# Patient Record
Sex: Female | Born: 1965 | Race: White | Hispanic: No | Marital: Married | Smoking: Never smoker
Health system: Southern US, Community
[De-identification: ages and names within clinical notes are randomized; demographics above are authoritative.]

## PROBLEM LIST (undated history)

## (undated) DIAGNOSIS — K8020 Calculus of gallbladder without cholecystitis without obstruction: Secondary | ICD-10-CM

## (undated) DIAGNOSIS — G43909 Migraine, unspecified, not intractable, without status migrainosus: Secondary | ICD-10-CM

## (undated) HISTORY — DX: Calculus of gallbladder without cholecystitis without obstruction: K80.20

## (undated) HISTORY — DX: Migraine, unspecified, not intractable, without status migrainosus: G43.909

---

## 2005-02-20 HISTORY — PX: LAPAROSCOPIC, COLECTOMY SIGMOID, LAR: SHX4477

## 2009-10-21 ENCOUNTER — Ambulatory Visit
Admission: RE | Admit: 2009-10-21 | Disposition: A | Discharge status: Home / Self Care | Payer: Self-pay | Source: Ambulatory Visit | Admitting: Gastroenterology

## 2010-07-31 ENCOUNTER — Emergency Department
Admission: EM | Admit: 2010-07-31 | Disposition: A | Discharge status: Home / Self Care | Payer: Self-pay | Source: Emergency Department | Admitting: Emergency Medicine

## 2013-10-17 ENCOUNTER — Other Ambulatory Visit
Admission: RE | Admit: 2013-10-17 | Discharge: 2013-10-17 | Disposition: A | Discharge status: Home / Self Care | Payer: No Typology Code available for payment source | Source: Ambulatory Visit | Attending: Gastroenterology | Admitting: Gastroenterology

## 2013-10-17 ENCOUNTER — Ambulatory Visit: Payer: Self-pay

## 2013-10-17 DIAGNOSIS — R1032 Left lower quadrant pain: Secondary | ICD-10-CM | POA: Insufficient documentation

## 2013-10-17 DIAGNOSIS — D126 Benign neoplasm of colon, unspecified: Secondary | ICD-10-CM

## 2013-10-20 LAB — LAB USE ONLY - HISTORICAL SURGICAL PATHOLOGY

## 2015-11-25 ENCOUNTER — Ambulatory Visit (INDEPENDENT_AMBULATORY_CARE_PROVIDER_SITE_OTHER): Payer: No Typology Code available for payment source | Admitting: Neurology

## 2015-11-25 ENCOUNTER — Encounter (INDEPENDENT_AMBULATORY_CARE_PROVIDER_SITE_OTHER): Payer: Self-pay | Admitting: Neurology

## 2015-11-25 VITALS — BP 128/79 | HR 69 | Resp 18 | Ht 66.0 in | Wt 178.0 lb

## 2015-11-25 DIAGNOSIS — G4440 Drug-induced headache, not elsewhere classified, not intractable: Secondary | ICD-10-CM

## 2015-11-25 DIAGNOSIS — M5481 Occipital neuralgia: Secondary | ICD-10-CM | POA: Insufficient documentation

## 2015-11-25 DIAGNOSIS — G43019 Migraine without aura, intractable, without status migrainosus: Secondary | ICD-10-CM

## 2015-11-25 MED ORDER — LIDOCAINE 5 % EX OINT
TOPICAL_OINTMENT | CUTANEOUS | 2 refills | Status: AC | PRN
Start: 2015-11-25 — End: ?

## 2015-11-25 MED ORDER — GABAPENTIN 300 MG PO CAPS
900.0000 mg | ORAL_CAPSULE | Freq: Every evening | ORAL | 6 refills | Status: AC
Start: 2015-11-25 — End: 2015-12-25

## 2015-11-25 MED ORDER — PREDNISONE 10 MG PO TABS
ORAL_TABLET | ORAL | 0 refills | Status: AC
Start: 2015-11-25 — End: ?

## 2015-11-25 NOTE — Progress Notes (Signed)
Maria Lowery, HEUMANN  Dec 08, 1965  [id number]      [facility]  50 y.o.  right handed handed female presents with headaches. Headaches started at age 67. The headaches are described as fire sesation. The pain comes on gradually, and does not resolves. The pain does sometimes wake the patient up at night, and sometimes present in the morning. There is no prodrome. There is no aura.    Began having "firing sensation" on the posterior head and radiate to the front of the head. Is constant is 8/10. Does not frequently has any pain free hours. Symptoms began 2.5 years ago  Migraines occur once a month but can last 3 days     Headaches are treated with Lyrica/Gabapentin. Naproxen, tylenol - treating daily     Associated symptoms:  Photophobia: No with current symptoms but migraines  Phonophobia: No with current symptoms but migraines  Osmophobia: No with current symptoms but migraines  Nausea: No with current symptoms but migraines  Vomiting: No with current symptoms but migraines  Anorexia/Hunger: No with current symptoms but migraines  Difficulty Thinking or Concentrating: Yes  Irritability/Depression: Yes   Fatigue: Yes   Blurry Vision: No with current symptoms but migraines  Hemianopsia: No with current symptoms but migraines  Micro/macrophotopsia: Yes  Allodynia: Yes  Neck Stiffness/Pain: Yes   Autonomic symptoms:  Ptosis or Miosis: No  Lacrimation: No  Conjuctival Injection: No  Rhinorrhea: No  Congestion: No  Foreign Body Sensation:No  Facial Sweating: No  Pallor: No  Neurological symptoms:  Diplopia: Yes - only with migraine  Dysarthria: No   Perioral Numbness: No   Vertigo: No   Aphasia: Yes   Hemisensory:Yes with migraine 2 hours   Hemiparesis/Hemiplegia: Yes with migraine 2 hours    Exacerbating factors:  Movement: Yes with migraine  Upright position: Yes with migraine   Lying down: No   Bending over: Yes with migraine   Coughing: Yes with migraine   Straining: Yes with migraine  Alleviating  factors:  Rest/Sleep: Yes   Darkness: Yes   Quiet: Yes   Activity: No   Heat: Yes   Cold: No    Triggers:  Stress: Yes  Post stress letdown: Yes  Bright/Flickering lights: No   Sounds: No   Smells: No   Too much/too little sleep: No   Skipping meals: Yes   Changes in the weather: No    Menses: Yes   Pregnancy: No   Alcohol: No   Foods: No  Airplane Flights: Yes   Exercise: Yes   Other:       Alternative treatments:  Massage: No   Acupuncture: Yes  Biofeedback: No    Prior work-up:  MRI of the brain - Normal April 2016    Prior meds:  Gabapentin   Imitrex - can   Aleve    No past medical history on file.      Current Outpatient Prescriptions:   .  acetaminophen (TYLENOL) 500 MG tablet, Take 500 mg by mouth., Disp: , Rfl:   .  gabapentin (NEURONTIN) 100 MG capsule, Take 100 mg by mouth nightly., Disp: , Rfl:   .  ibuprofen (ADVIL,MOTRIN) 200 MG tablet, Take 200 mg by mouth every 6 (six) hours as needed for Pain., Disp: , Rfl:   .  LO LOESTRIN FE 1 MG-10 MCG / 10 MCG Tab, , Disp: , Rfl:   .  naproxen sodium (ANAPROX) 220 MG tablet, Take 220 mg by mouth 2 (two)  times daily with meals., Disp: , Rfl:   .  gabapentin (NEURONTIN) 300 MG capsule, Take 3 capsules (900 mg total) by mouth nightly., Disp: 90 capsule, Rfl: 6  .  lidocaine (XYLOCAINE) 5 % ointment, Apply topically as needed (pain)., Disp: 50 g, Rfl: 2  .  predniSONE (DELTASONE) 10 MG tablet, Taper as follows: 6 tab day 1,2; 5 tab day 3,4; 4 tabs day 5,6; 3 tab day 7,8; 2 tab day 9, 1 tab day 10, Disp: 39 tablet, Rfl: 0    Allergies   Allergen Reactions   . Other      IV Contrast dye       Social History     Social History   . Marital status: Married     Spouse name: N/A   . Number of children: N/A   . Years of education: N/A     Occupational History   . Not on file.     Social History Main Topics   . Smoking status: Never Smoker   . Smokeless tobacco: Never Used   . Alcohol use No   . Drug use: No   . Sexual activity: Not on file     Other Topics Concern   . Not  on file     Social History Narrative   . No narrative on file       Family History   Problem Relation Age of Onset   . Migraines Father        Lifestyle: does not exercise, sleeps 7 hours with a regular schedule, eats breakfast, drinks 6 cups of fluids (tea) and 6 cups of caffeine (Tea)     Review of Systems:  General: patient denies fever, chills    Head and neck: patient denies sore throat, blurry vision  Pulmonary: patient denies cough, SOB  Cardiovascular/cholesterol: patient denies chest pain, palpitations   Neurologic: See HPI   GI: patient denies nausea, vomiting, diarrhea or constipation  GU: patient denies dysuria   Women:  Age of menarche: 38  Age of menopause: 18     Regular cycles: No  OCP use: yes     Number of pregnancies: 2 Number of miscarriages: 0  Endocrine: patient denies heat/cold intolerance   Rheum: patient denies joint pain  Heme/Onc: patient denies easy bruising  Derm/bone: patient denies rashes  Psychiatric: patient denies anxiety or depression     Childhood Precursors:  Cyclical Vomiting: No  Motion Sickness: Yes  Somnambulism: No    PE:  Vitals:    11/25/15 1113   BP: 128/79   BP Site: Left arm   Patient Position: Sitting   Cuff Size: Medium   Pulse: 69   Resp: 18   Weight: 80.7 kg (178 lb)   Height: 1.676 m (5\' 6" )       HEENT: NCAT  Heart: RRR, no bruits  Resp: CTAB  Abd: NTND  Neuro:  Mental status: normal cognition, fluent, psychological good affect   Cranial nerves: PERRL, disks sharp, EOMI, VFF, sens intact to LT, face symmetric, hearing intact to voice, tongue/uvula/palate midline, good SCM strength and shoulder shrug  Motor: no pronator drift, normal tone and bulk, 5/5  Sensory: intact to light touch, cold, vibration  DTR: 2+ throughout, toes down  Coor: intact FTN and HTS B/L   Gait: narrow, toe/heel/tandem intact, negative Romberg    Impression:  50 y.o. female with   1. Medication overuse headache     2. Intractable migraine without aura and without  status migrainosus     3.  Bilateral occipital neuralgia       - Has infrequent migraine, symptoms of occipital neuralgia  - Using OTC daily which are likely contributing to headache frequency  - Stop OTC for the next 2 weeks, then limit to 2 days/week   - Gabapentin 300 -> 600 -> 900  - prednisone taper   - Occipital nerve block   - May benefit from magnesium   - Lidocaine cream  - May benefit from another migraine rescue but will hold off until occipital neuralgia pain better controlled     Lifestyle modifications:  continue exercise, keep a regular sleep schedule, have breakfast with protein, continue hydration;  - RTC ASAP for nerve block, then 2 monts    Greater than 30 minutes of this 45 minute visit spent on education and counseling regarding diagnosis and treatment including lifestyle modifications, proper use of preventive and abortive medication and avoidance of medication overuse.

## 2015-11-25 NOTE — Patient Instructions (Addendum)
1. Instructions were provided regarding your diagnosis and treatment plan.     2. Lifestyle modifications were reviewed: exercise regularly, keep a regular sleep schedule, eat breakfast with protein, drink plenty of fluids.     3. Remember to limit acute headache treatment medicines (like triptans or anti-inflammatories) to 2 days/week to avoid medication-overuse headache.    For migraine education resources:  American Headache Society: https://americanheadachesociety.org  American Migraine Foundation: https://americanmigrainefoundation.org    Stop Over the counter medications for the next 2 weeks, then after that limit use to 3 days/week     Start Gabapentin 300 mg at bedtime for 2 weeks, then increase to 2 tabs for 2 weeks, then 3 tabs     Code for nerve block is CPT code 96045    Prednisone taper schedule with Prednisone 10 mg tablets:  Day 1 and 2: 6 tablets in the morning with breakfast  Day 3 and 4: 5 tablets in the morning with breakfast  Day 5 and 6: 4 tablets in the morning with breakfast  Day 7 and 8: 3 tablets in the morning with breakfast  Day 9: 2 tablets in the morning with breakfast  Day 10: 1 tablet in the morning with breakfast     Can use lidocaine cream on posterior head pain

## 2016-01-25 ENCOUNTER — Ambulatory Visit (INDEPENDENT_AMBULATORY_CARE_PROVIDER_SITE_OTHER): Payer: No Typology Code available for payment source | Admitting: Acute Care

## 2016-04-17 ENCOUNTER — Encounter (INDEPENDENT_AMBULATORY_CARE_PROVIDER_SITE_OTHER): Payer: Self-pay | Admitting: Neurology

## 2016-04-17 ENCOUNTER — Ambulatory Visit (INDEPENDENT_AMBULATORY_CARE_PROVIDER_SITE_OTHER): Payer: No Typology Code available for payment source | Admitting: Neurology

## 2016-04-17 VITALS — BP 112/71 | HR 77 | Ht 66.0 in | Wt 180.0 lb

## 2016-04-17 DIAGNOSIS — M5481 Occipital neuralgia: Secondary | ICD-10-CM

## 2016-04-17 DIAGNOSIS — G43019 Migraine without aura, intractable, without status migrainosus: Secondary | ICD-10-CM

## 2016-04-17 MED ORDER — GABAPENTIN 300 MG PO CAPS
ORAL_CAPSULE | ORAL | 11 refills | Status: AC
Start: 2016-04-17 — End: ?

## 2016-04-17 MED ORDER — ELETRIPTAN HYDROBROMIDE 40 MG PO TABS
40.0000 mg | ORAL_TABLET | ORAL | 11 refills | Status: AC | PRN
Start: 2016-04-17 — End: 2016-05-17

## 2016-04-17 NOTE — Patient Instructions (Addendum)
1. Instructions were provided regarding your diagnosis and treatment plan.     2. Lifestyle modifications were reviewed: exercise regularly, keep a regular sleep schedule, eat breakfast with protein, drink plenty of fluids.     3. Remember to limit acute headache treatment medicines (like triptans or anti-inflammatories) to 2 days/week to avoid medication-overuse headache.    For migraine education resources:  American Headache Society: https://americanheadachesociety.org  American Migraine Foundation: https://americanmigrainefoundation.org    Increase Gabapentin to 1200mg  at night for 2 weeks, then 1 tab in the am, 4 at night.     Start Magnesium citrate or glycinate 400mg      Trial of relpax

## 2016-04-17 NOTE — Progress Notes (Signed)
Maria Lowery, FAROOQ  09/20/1965  [id number]    [facility]    51 y.o. female last seen on 11/25/15 for occipital neuralgia.    Has been taking Gabapentin 900mg      Every time she goes up a dose, pain improves.     Hormones increased blood pressure.     Feels pain on entire head, sometimes on vertex. Does not have days without the     Gets migraines twice a week, treats with naproxen but last 1 day. Is careful to not overuse NSAIDs       Current Outpatient Prescriptions:   .  acetaminophen (TYLENOL) 500 MG tablet, Take 500 mg by mouth., Disp: , Rfl:   .  eletriptan (RELPAX) 40 MG tablet, Take 1 tablet (40 mg total) by mouth as needed for Migraine.Max 2 tabs in 24 hours, limit use to 10 days/month, Disp: 12 tablet, Rfl: 11  .  gabapentin (NEURONTIN) 100 MG capsule, Take 100 mg by mouth nightly., Disp: , Rfl:   .  gabapentin (NEURONTIN) 300 MG capsule, 4 tabs at bedtime for 2 weeks, then 1 in the am and 4 at night, Disp: 150 capsule, Rfl: 11  .  ibuprofen (ADVIL,MOTRIN) 200 MG tablet, Take 200 mg by mouth every 6 (six) hours as needed for Pain., Disp: , Rfl:   .  lidocaine (XYLOCAINE) 5 % ointment, Apply topically as needed (pain)., Disp: 50 g, Rfl: 2  .  LO LOESTRIN FE 1 MG-10 MCG / 10 MCG Tab, , Disp: , Rfl:   .  naproxen sodium (ANAPROX) 220 MG tablet, Take 220 mg by mouth 2 (two) times daily with meals., Disp: , Rfl:   .  predniSONE (DELTASONE) 10 MG tablet, Taper as follows: 6 tab day 1,2; 5 tab day 3,4; 4 tabs day 5,6; 3 tab day 7,8; 2 tab day 9, 1 tab day 10, Disp: 39 tablet, Rfl: 0    PRIOR MEDS:  Gabapentin   Imitrex   Aleve    ROS:  Is not exercising, sleep 7 hours, eats breakfast, drinks at least 8 cups of fluids   Negative for visual changes, rash, bleeding abnormalities, cardiac, GI, respiratory issues    PE:  Vitals:    04/17/16 0802   BP: 112/71   BP Site: Right arm   Patient Position: Sitting   Cuff Size: Medium   Pulse: 77   Weight: 81.6 kg (180 lb)   Height: 1.676 m (5\' 6" )       GEN:  no acute distress  CV: RR  MS: fluent with no aphasia   CN: PERRL, EOMI, VFF, face symmetric, T midline  M: no PND, normal tone, 5/5 throughout   Gait: narrow with good armswing    IMP:  50 y.o.female with   1. Intractable migraine without aura and without status migrainosus     2. Bilateral occipital neuralgia       - Some slight improvement but still has symptoms   - Increase Gabapentin gradually to 300 and 1200 mg   - Start magnesium 400 mg daily for prevention   - Relpax for migraine rescue    - Lifestyle: exercise as tolerated, keep a regular sleep schedule, eat breakfast with protein, increase hydration      Greater than 15 minutes of this 30 minute visit was spent on education and counseling regarding diagnosis of migraine headaches and occipital neuralgia and treatment that includes medication and lifestyle modifications that include exercise 4 days  per week, increase non-caffeinated fluid intake to 64 ounces per day, eat regular meals and breakfast with protein, limit caffeine intake to 200mg  per day and sleep 7-8 hour per night; proper use of the preventive medication which is gabapentin and the abortive medication which is relpax. Also limit use of rescue medications to 10 days per month (15 days for NSAIDs) to avoid medication overuse.

## 2016-05-15 ENCOUNTER — Ambulatory Visit (INDEPENDENT_AMBULATORY_CARE_PROVIDER_SITE_OTHER): Payer: No Typology Code available for payment source | Admitting: Neurology

## 2016-07-21 ENCOUNTER — Encounter: Payer: Self-pay | Admitting: Neurology

## 2016-07-21 DIAGNOSIS — R519 Headache, unspecified: Secondary | ICD-10-CM

## 2016-08-14 ENCOUNTER — Ambulatory Visit
Admission: RE | Admit: 2016-08-14 | Discharge: 2016-08-14 | Disposition: A | Discharge status: Home / Self Care | Payer: No Typology Code available for payment source | Source: Ambulatory Visit | Attending: Neurology | Admitting: Neurology

## 2016-08-14 ENCOUNTER — Other Ambulatory Visit: Payer: Self-pay | Admitting: Neurology

## 2016-08-14 DIAGNOSIS — R519 Headache, unspecified: Secondary | ICD-10-CM

## 2016-08-14 DIAGNOSIS — R51 Headache: Secondary | ICD-10-CM | POA: Insufficient documentation

## 2016-08-21 ENCOUNTER — Ambulatory Visit (INDEPENDENT_AMBULATORY_CARE_PROVIDER_SITE_OTHER)
Admission: RE | Admit: 2016-08-21 | Discharge: 2016-08-21 | Disposition: A | Discharge status: Home / Self Care | Payer: No Typology Code available for payment source | Source: Ambulatory Visit | Attending: Neurology | Admitting: Neurology

## 2016-08-21 ENCOUNTER — Other Ambulatory Visit: Payer: Self-pay | Admitting: Neurology

## 2016-08-21 DIAGNOSIS — R519 Headache, unspecified: Secondary | ICD-10-CM

## 2019-03-03 ENCOUNTER — Other Ambulatory Visit: Payer: Self-pay | Admitting: Family

## 2019-03-24 ENCOUNTER — Emergency Department: Payer: No Typology Code available for payment source

## 2019-03-24 ENCOUNTER — Emergency Department
Admission: EM | Admit: 2019-03-24 | Discharge: 2019-03-24 | Disposition: A | Discharge status: Home / Self Care | Payer: No Typology Code available for payment source | Attending: Emergency Medicine | Admitting: Emergency Medicine

## 2019-03-24 DIAGNOSIS — R200 Anesthesia of skin: Secondary | ICD-10-CM | POA: Insufficient documentation

## 2019-03-24 DIAGNOSIS — W19XXXA Unspecified fall, initial encounter: Secondary | ICD-10-CM

## 2019-03-24 DIAGNOSIS — Y9389 Activity, other specified: Secondary | ICD-10-CM | POA: Insufficient documentation

## 2019-03-24 DIAGNOSIS — S22080A Wedge compression fracture of T11-T12 vertebra, initial encounter for closed fracture: Secondary | ICD-10-CM

## 2019-03-24 DIAGNOSIS — S22089A Unspecified fracture of T11-T12 vertebra, initial encounter for closed fracture: Secondary | ICD-10-CM | POA: Insufficient documentation

## 2019-03-24 DIAGNOSIS — Y92000 Kitchen of unspecified non-institutional (private) residence as  the place of occurrence of the external cause: Secondary | ICD-10-CM | POA: Insufficient documentation

## 2019-03-24 DIAGNOSIS — R202 Paresthesia of skin: Secondary | ICD-10-CM | POA: Insufficient documentation

## 2019-03-24 DIAGNOSIS — W1830XA Fall on same level, unspecified, initial encounter: Secondary | ICD-10-CM | POA: Insufficient documentation

## 2019-03-24 LAB — CBC AND DIFFERENTIAL
Absolute NRBC: 0 10*3/uL (ref 0.00–0.00)
Basophils Absolute Automated: 0.04 10*3/uL (ref 0.00–0.08)
Basophils Automated: 0.4 %
Eosinophils Absolute Automated: 0.01 10*3/uL (ref 0.00–0.44)
Eosinophils Automated: 0.1 %
Hematocrit: 44.7 % — ABNORMAL HIGH (ref 34.7–43.7)
Hgb: 14.8 g/dL (ref 11.4–14.8)
Immature Granulocytes Absolute: 0.04 10*3/uL (ref 0.00–0.07)
Immature Granulocytes: 0.4 %
Lymphocytes Absolute Automated: 2.1 10*3/uL (ref 0.42–3.22)
Lymphocytes Automated: 18.5 %
MCH: 29.4 pg (ref 25.1–33.5)
MCHC: 33.1 g/dL (ref 31.5–35.8)
MCV: 88.9 fL (ref 78.0–96.0)
MPV: 10.2 fL (ref 8.9–12.5)
Monocytes Absolute Automated: 0.74 10*3/uL (ref 0.21–0.85)
Monocytes: 6.5 %
Neutrophils Absolute: 8.4 10*3/uL — ABNORMAL HIGH (ref 1.10–6.33)
Neutrophils: 74.1 %
Nucleated RBC: 0 /100 WBC (ref 0.0–0.0)
Platelets: 325 10*3/uL (ref 142–346)
RBC: 5.03 10*6/uL (ref 3.90–5.10)
RDW: 13 % (ref 11–15)
WBC: 11.33 10*3/uL — ABNORMAL HIGH (ref 3.10–9.50)

## 2019-03-24 LAB — COMPREHENSIVE METABOLIC PANEL
ALT: 112 U/L — ABNORMAL HIGH (ref 0–55)
AST (SGOT): 52 U/L — ABNORMAL HIGH (ref 5–34)
Albumin/Globulin Ratio: 1.3 (ref 0.9–2.2)
Albumin: 4.5 g/dL (ref 3.5–5.0)
Alkaline Phosphatase: 71 U/L (ref 37–106)
Anion Gap: 12 (ref 5.0–15.0)
BUN: 17.3 mg/dL (ref 7.0–19.0)
Bilirubin, Total: 0.3 mg/dL (ref 0.2–1.2)
CO2: 26 mEq/L (ref 22–29)
Calcium: 10.3 mg/dL (ref 8.5–10.5)
Chloride: 103 mEq/L (ref 100–111)
Creatinine: 0.7 mg/dL (ref 0.6–1.0)
Globulin: 3.5 g/dL (ref 2.0–3.6)
Glucose: 93 mg/dL (ref 70–100)
Potassium: 4.7 mEq/L (ref 3.5–5.1)
Protein, Total: 8 g/dL (ref 6.0–8.3)
Sodium: 141 mEq/L (ref 136–145)

## 2019-03-24 LAB — GFR: EGFR: >60

## 2019-03-24 LAB — LIPASE: Lipase: 31 U/L (ref 8–78)

## 2019-03-24 MED ORDER — CYCLOBENZAPRINE HCL 10 MG PO TABS
10.0000 mg | ORAL_TABLET | Freq: Three times a day (TID) | ORAL | 0 refills | Status: AC | PRN
Start: 2019-03-24 — End: ?

## 2019-03-24 MED ORDER — SODIUM CHLORIDE 0.9 % IV BOLUS
1000.0000 mL | Freq: Once | INTRAVENOUS | Status: AC
Start: 2019-03-24 — End: 2019-03-24
  Administered 2019-03-24: 1000 mL via INTRAVENOUS

## 2019-03-24 NOTE — Discharge Instructions (Signed)
Compression Fracture    You were seen and treated for a compression fracture in the back.      Your back is made up of many vertebrae. These bones are square-shaped and they protect your spinal cord and nerves. Trauma can cause the vertebrae to be compressed (squashed). The compression causes the vertebrae to lose height. Most fractures do not require surgery. However, sometimes a piece of bone can be chipped off into the spinal canal. This is where the spinal cord is. If this happens, surgery is needed to take out the piece of bone. If the fracture does not need surgery you will be put in a TLSO (thoracolumbarsacral) brace. This helps keep the spine straight to lower pain and prevent more injury.     These fractures can happen with little trauma if there is an underlying disease that weakens the bones. This can be osteoporosis (weak bones) or metastatic disease (cancer that has spread to the bone). These fractures also happen in healthy patients usually from major trauma like a fall or car accident.     Some common symptoms of a compression fracture are:   Back pain.   Weakness in the legs.   Numbness or tingling in the legs or near the genitals (privates).   Urine (pee) or fecal (poop) incontinence (leaking pee or poop).      Shortness of breath.   Chest pain.   Abdominal (belly) pain.    Though we don't believe your condition is dangerous right now, it is important to be careful. Sometimes a problem that seems mild can become serious later. This is why it is very important that you return here or go to the nearest Emergency Department if you are not improving or your symptoms are getting worse.    You may be given medications when you leave the hospital. These may include one or more of the following:     Pain medication - Do not drive or use heavy machinery while taking narcotic (opiate) pain medications. You can take over-the-counter pain medicines instead if you are not in a lot of pain. These  medicines can include ibuprofen (Advil/Motrin) or acetaminophen (Tylenol). Follow the directions on the package. Pain medication can upset your stomach. If so, take nausea medicine before you take the pain medication.   Nausea medication - These medications are to treat nausea and keep you from vomiting (throwing up). Some medications may make you sleepy (drowsy), but others don't have many side effects. Some medications are given as suppositories or dissolvable tablets. This way, you don't have to swallow them if you feel sick to your stomach (nauseated). Follow the directions on the package.     Stool softeners - These medications help with the constipation caused by narcotic medications and surgery. Constipation and hard stools (hard poop) can make the pain in your abdomen (belly) much worse.   You will be scheduled for a follow-up appointment. This is usually with your primary care doctor. Since you have a fracture, you may follow up with an orthopedic surgeon (bone doctor) or a neurosurgeon (brain, spinal cord and nerve surgeon). Call your doctor as soon as possible if you can't keep your appointment. You can also call if you have questions about your symptoms.     Back Brace Instructions:   Keep your brace dry.    Itching. Do not stick objects like coat hangers inside the brace to scratch itching skin. If itching continues, contact your doctor.   Trimming. Do   not break rough edges off the brace or trim the brace before asking your doctor.   Skin. Look at the skin around the brace. If your skin gets red or raw around the brace, contact your doctor.   Inspect the brace regularly. If it becomes cracked or gets soft spots, contact your doctor's office.   Your level of activity will depend on how serious your fracture is. Talk about your limitations with your doctor before leaving the hospital.     YOU SHOULD SEEK MEDICAL ATTENTION IMMEDIATELY, EITHER HERE OR AT THE NEAREST EMERGENCY DEPARTMENT, IF  ANY OF THE FOLLOWING OCCUR:   You see red streaks going up from the surgical site.    You have weakness in your legs.   You have numbness or tingling in your legs or near the genitals (privates).   You have urine or fecal incontinence (you wet or soil yourself).    You have abdominal (belly) pain.   You have swelling or pain in your calf.    Your chest hurts a lot or you get short of breath.    If you can't follow up with your doctor, or if at any time you feel you need to be rechecked or seen again, come back here or go to the nearest emergency department.               Paresthesias    You have been seen for paresthesias.    Paresthesia is an abnormal sensation (feeling) in any part of the body. The paresthesia itself has no long-term bad effects. People often describe it as tingling, numbness, burning, or pricking of the skin. Many say it feels like "pins and needles," or like the body part is asleep.    Paresthesias can be a symptom of some illnesses. This means there are many things that can cause paresthesias. The paresthesias can be a sign of an underlying medical condition.     Some causes of paresthesias are:   Skin Problems: Irritation of skin by certain chemicals. Swelling of the skin from an injury. A burn or frostbite can feel like numbness.   Pressure on a nerve. This can happen when your arm "falls asleep" from lying on it too long. Carpal tunnel syndrome can do the same thing.   Hyperventilation (rapid or deep breathing).   Deficiency in some vitamins and minerals. This includes vitamins B1, B5, and B12.   Electrolyte problems.   Diabetic neuropathy (nerve disorders) from long-standing diabetes.   Problems with circulation.   Strokes.    You may have had some testing to help find out the cause of your paresthesias.    We still do not know the cause of your paresthesias. However, it is OK for you to go home. You may need more tests to figure out the cause.    See your primary  care doctor or the referral specialist for more work-up and management of your paresthesias.    YOU SHOULD SEEK MEDICAL ATTENTION IMMEDIATELY, EITHER HERE OR AT THE NEAREST EMERGENCY DEPARTMENT, IF ANY OF THE FOLLOWING OCCUR:   Your arms get weak, numb or paralyzed (can't move), especially on one side.   You have vision loss, trouble speaking or problems thinking.   Your speech is abnormal or one side of your face droops.   You lose consciousness ("pass out") or almost lose consciousness.   You have numbness or tingling after a head, neck or back injury.   You feel very dizzy or  like the room is spinning.   You have other concerns.

## 2019-03-24 NOTE — ED Provider Notes (Signed)
Nursing (triage) note reviewed for the following pertinent information:         History     Chief Complaint   Patient presents with   . Back Pain   . Numbness     54 year old female presents to the emergency department after a fall at home 4 days ago.  She was trying to pull her refrigerator out and the handle broke.  She fell backwards onto her back.  She felt "shock" throughout her entire body.  She immediately had to urinate.  She thinks she hit her head.  She has been having numbness and tingling in her arms and legs since then.  Yesterday she also started with tingling in her tongue and mouth.  [facility] negative.  She came to the ER now because of the continued tingling.  She has no bowel or bladder incontinence or retention.  No numbness or tingling around her rectum.  Normal strength.  No pain when she is lying in the bed.  The pain is worse when she is up and moving.  She declines pain medication.  Patient has been having some abdominal pain since the fall.  She says she has chronic abdominal pain from her gallbladder but this feels different.    The history is provided by the patient.   Back Pain  Associated symptoms: abdominal pain, headaches and numbness    Associated symptoms: no chest pain, no dysuria, no fever and no weakness         Past Medical History:   Diagnosis Date   . Gall stones    . Migraine        Past Surgical History:   Procedure Laterality Date   . LAPAROSCOPIC, COLECTOMY SIGMOID, LAR  2007       Family History   Problem Relation Age of Onset   . Migraines Father        Social  Social History     Tobacco Use   . Smoking status: Never Smoker   . Smokeless tobacco: Never Used   Substance Use Topics   . Alcohol use: No   . Drug use: No       .     Allergies   Allergen Reactions   . Other      IV Contrast dye   . Sulfa Antibiotics        Home Medications             gabapentin (NEURONTIN) 100 MG capsule     Take 100 mg by mouth nightly.      gabapentin (NEURONTIN) 300 MG capsule     4 tabs at bedtime for 2 weeks, then 1 in the am and 4 at night           Flagged for Removal             acetaminophen (TYLENOL) 500 MG tablet     Take 500 mg by mouth.     ibuprofen (ADVIL,MOTRIN) 200 MG tablet     Take 200 mg by mouth every 6 (six) hours as needed for Pain.     lidocaine (XYLOCAINE) 5 % ointment     Apply topically as needed (pain).     LO LOESTRIN FE 1 MG-10 MCG / 10 MCG Tab          naproxen sodium (ANAPROX) 220 MG tablet     Take 220 mg by mouth 2 (two) times daily  with meals.     predniSONE (DELTASONE) 10 MG tablet     Taper as follows: 6 tab day 1,2; 5 tab day 3,4; 4 tabs day 5,6; 3 tab day 7,8; 2 tab day 9, 1 tab day 10           Review of Systems   Constitutional: Negative for chills and fever.   HENT: Negative for congestion, rhinorrhea and sore throat.    Respiratory: Negative for cough, chest tightness and shortness of breath.    Cardiovascular: Negative for chest pain and palpitations.   Gastrointestinal: Positive for abdominal pain. Negative for diarrhea, nausea and vomiting.   Genitourinary: Negative for dysuria and frequency.   Musculoskeletal: Positive for back pain. Negative for myalgias.   Skin: Negative for color change and rash.   Neurological: Positive for numbness and headaches. Negative for dizziness and weakness.   Psychiatric/Behavioral: Negative for confusion. The patient is not nervous/anxious.        Physical Exam    BP: 135/84, Heart Rate: 84, Temp: 97.7 F (36.5 C), Resp Rate: 16, SpO2: 99 %, Weight: 79.1 kg    Physical Exam  Vitals signs and nursing note reviewed.   Constitutional:       Appearance: Normal appearance. She is well-developed.   HENT:      Head: Normocephalic and atraumatic.   Eyes:      General:         Right eye: No discharge.         Left eye: No discharge.      Extraocular Movements: Extraocular movements intact.      Conjunctiva/sclera: Conjunctivae normal.      Pupils: Pupils are equal, round, and reactive  to light.   Neck:      Musculoskeletal: Normal range of motion and neck supple.      Comments: No point tenderness in neck  Cardiovascular:      Rate and Rhythm: Normal rate and regular rhythm.      Heart sounds: Normal heart sounds.   Pulmonary:      Effort: Pulmonary effort is normal. No respiratory distress.      Breath sounds: Normal breath sounds. No wheezing or rales.   Abdominal:      General: There is no distension.      Palpations: Abdomen is soft.      Tenderness: There is no abdominal tenderness. There is no guarding or rebound.   Musculoskeletal: Normal range of motion.         General: No tenderness.      Comments: No point tenderness in back   Skin:     General: Skin is warm and dry.   Neurological:      General: No focal deficit present.      Mental Status: She is alert and oriented to person, place, and time.      Cranial Nerves: No cranial nerve deficit.      Sensory: No sensory deficit.      Motor: No weakness.      Deep Tendon Reflexes: Reflexes normal.      Comments: 5 out of 5 dorsiflexion and plantarflexion bilaterally, normal sensation, 2+ patellar reflexes bilaterally, negative straight leg raise   Psychiatric:         Mood and Affect: Mood normal.         Behavior: Behavior normal.         Thought Content: Thought content normal.         Judgment:  Judgment normal.           MDM and ED Course     ED Medication Orders (From admission, onward)    Start Ordered     Status Ordering Provider    03/24/19 1527 03/24/19 1526  sodium chloride 0.9 % bolus 1,000 mL  Once     Route: Intravenous  Ordered Dose: 1,000 mL     Last MAR action: New Bag Joachim Carton H             MDM  Number of Diagnoses or Management Options  Compression fracture of T11 vertebra, initial encounter:   Fall, initial encounter:   Numbness and tingling:   Diagnosis management comments: Differential diagnosis: Back fracture, "stinger", doubt spinal cord injury  Plan: Labs, ct, declines analgesia    I, Nita Sells, M.D, have been the  primary provider for Maria Lowery during this Emergency Dept visit.  Oxygen saturation by pulse oximetry is 95%-100%, Normal.  Interventions: None Needed.    I wore the appropriate PPE while caring for this patient.    Pt with compression fx of t11. I d/w Dr Blase Mess who says she can go home and he will see her in his office for a brace. He will f/u with her on all her spine issues. Pt to return if worsens or any concerns. She was given a copy of her results to take to her doctor for further w/u.           Amount and/or Complexity of Data Reviewed  Clinical lab tests: ordered and reviewed  Tests in the radiology section of CPT: reviewed and ordered  Discuss the patient with other providers: yes (See above)    Patient Progress  Patient progress: improved             Radiology Results (24 Hour)     Procedure Component Value Units Date/Time    CT T- Spine without Contrast [[id number]] Collected: 03/24/19 1701    Order Status: Completed Updated: 03/24/19 1714    Narrative:      INDICATION: Fall.    TECHNIQUE: Axial reconstructed noncontrast CT imaging through the  thoracic and lumbar spine, with sagittal and coronal reformats reviewed.    The following dose reduction techniques were utilized: automated  exposure control and/or adjustment of the mA and/or kV according to  patient size, and the use of iterative reconstruction technique.     COMPARISON: None available.    FINDINGS:    CT THORACIC SPINE:  Diffuse osteopenia. Superior endplate compression fracture of T11  vertebra with approximate 25% height loss. No osseous retropulsion or  spinal canal compromise. Mild paravertebral stranding.    Alignment in the thoracic spine is maintained. Vertebral height is  otherwise maintained. No destructive osseous lesions. The posterior  elements are intact.     Small anterior degenerative osteophytes and disc degeneration with mild  disc height loss throughout the thoracic spine. Associated multilevel  mild disc bulges  throughout the thoracic spine most prominent at T10-T11  level mildly indenting the ventral thecal sac. No high-grade spinal  canal or neural foraminal stenosis in the thoracic spine appreciated on  CT.    CT LUMBAR SPINE:  Diffuse osteopenia. Alignment is maintained. Vertebral height is  maintained.     Focal densely sclerotic lesion in the anterior L5 vertebral body  measuring up to 1.5 cm that could reflect a bone island. Smaller  sclerotic lesion measuring up to 0.6 cm left laterally adjacent within  the L5 vertebra and 0.4 cm sclerotic lesion in the posterior left L1  vertebral body that may also reflect bone islands.    Small anterior degenerative osteophytes throughout the lumbar spine.  Disc degeneration with severe disc height loss and degenerative vacuum  cleft phenomenon within the L5-S1 disc space. Associated partially  calcified disc herniation with suggestion of inferior migrated component  that may contact the traversing S1 nerve roots. Otherwise, no high-grade  spinal canal stenosis appreciated at the remaining lumbar spine levels.  Foraminal extension of disc bulge, lateral endplate osteophytes and  facet arthropathy contribute to mild neural foraminal narrowing at L5-S1  level. The remaining lumbar neural foramen appear patent.      Impression:          1. Mild superior endplate compression fracture of T11 vertebra. No  osseous retropulsion or spinal canal compromise.    2. Mild degenerative changes in the thoracic spine.    3. No acute fracture subluxation in the lumbar spine. Degenerative  changes as detailed above, notable for disc herniation at L5-S1 level  that may contact the traversing S1 nerve roots.    4. Sclerotic lesions in the L1 and L5 vertebra that could reflect  bone  islands. Correlate clinically for any history of malignancy. Bone scan  follow up could be performed to further evaluate if indicated.    Findings discussed with Dr. Nita Sells 03/24/2019 5:05 PM.    Gaylyn Rong,  MD   03/24/2019 5:12 PM    CT Reconstruction L-spine [[id number]] Collected: 03/24/19 1701    Order Status: Completed Updated: 03/24/19 1714    Narrative:      INDICATION: Fall.    TECHNIQUE: Axial reconstructed noncontrast CT imaging through the  thoracic and lumbar spine, with sagittal and coronal reformats reviewed.    The following dose reduction techniques were utilized: automated  exposure control and/or adjustment of the mA and/or kV according to  patient size, and the use of iterative reconstruction technique.     COMPARISON: None available.    FINDINGS:    CT THORACIC SPINE:  Diffuse osteopenia. Superior endplate compression fracture of T11  vertebra with approximate 25% height loss. No osseous retropulsion or  spinal canal compromise. Mild paravertebral stranding.    Alignment in the thoracic spine is maintained. Vertebral height is  otherwise maintained. No destructive osseous lesions. The posterior  elements are intact.     Small anterior degenerative osteophytes and disc degeneration with mild  disc height loss throughout the thoracic spine. Associated multilevel  mild disc bulges throughout the thoracic spine most prominent at T10-T11  level mildly indenting the ventral thecal sac. No high-grade spinal  canal or neural foraminal stenosis in the thoracic spine appreciated on  CT.    CT LUMBAR SPINE:  Diffuse osteopenia. Alignment is maintained. Vertebral height is  maintained.     Focal densely sclerotic lesion in the anterior L5 vertebral body  measuring up to 1.5 cm that could reflect a bone island. Smaller  sclerotic lesion measuring up to 0.6 cm left laterally adjacent within  the L5 vertebra and 0.4 cm sclerotic lesion in the posterior left L1  vertebral body that may also reflect bone islands.    Small anterior degenerative osteophytes throughout the lumbar spine.  Disc degeneration with severe disc height loss and degenerative vacuum  cleft phenomenon within the L5-S1 disc space. Associated partially   calcified disc herniation with suggestion of inferior migrated component  that may contact the  traversing S1 nerve roots. Otherwise, no high-grade  spinal canal stenosis appreciated at the remaining lumbar spine levels.  Foraminal extension of disc bulge, lateral endplate osteophytes and  facet arthropathy contribute to mild neural foraminal narrowing at L5-S1  level. The remaining lumbar neural foramen appear patent.      Impression:          1. Mild superior endplate compression fracture of T11 vertebra. No  osseous retropulsion or spinal canal compromise.    2. Mild degenerative changes in the thoracic spine.    3. No acute fracture subluxation in the lumbar spine. Degenerative  changes as detailed above, notable for disc herniation at L5-S1 level  that may contact the traversing S1 nerve roots.    4. Sclerotic lesions in the L1 and L5 vertebra that could reflect  bone  islands. Correlate clinically for any history of malignancy. Bone scan  follow up could be performed to further evaluate if indicated.    Findings discussed with Dr. Nita Sells 03/24/2019 5:05 PM.    Gaylyn Rong, MD   03/24/2019 5:12 PM    CT Abdomen Pelvis WO IV/ WO PO Cont [[id number]] Collected: 03/24/19 1650    Order Status: Completed Updated: 03/24/19 1658    Narrative:      The following dose reduction techniques were utilized: automated  exposure control and/or adjustment of the mA and/or kV according to  patient size, and/or the use iterative reconstruction technique.    History: Pain status post fall.    COMPARISON: None    TECHNIQUE: CT of the abdomen and pelvis without contrast.    FINDINGS: There is minimal atelectasis at the lung bases. Small hiatal  hernia is present.    No free intraperitoneal air. Calcified gallstones are present. The  largest gallstone measures 3.6 cm. The liver, spleen, adrenal glands and  pancreas are unremarkable on this noncontrast study.    There is no hydronephrosis. No renal calculi. No ureteral or  bladder  calculi are seen.    Aorta is normal in caliber. No retroperitoneal hematoma, hemoperitoneum  or free pelvic fluid.    No bowel obstruction. Moderate stool in the colon. Normal appendix.  Surgical sutures are present in the rectosigmoid colon.    The uterus is present. Uterus is mildly enlarged. Probable uterine  fibroids are present. Bladder partially distended. No adnexal masses are  seen.    Aorta is normal in caliber. There is no adenopathy or ascites.  Degenerative changes are present in the lumbar spine. Sclerotic lesion  L5, likely bone island.      Impression:        1. No evidence of acute traumatic injury on this noncontrast study.  2. Gallstones.  3. Other findings as above.    Jasmine December D'Heureux, MD   03/24/2019 4:56 PM    CT Head without Contrast [[id number]] Collected: 03/24/19 1647    Order Status: Completed Updated: 03/24/19 1653    Narrative:      INDICATION: Fall.    TECHNIQUE: Axial noncontrast CT imaging through the head and cervical  spine performed, with sagittal and coronal reformats reviewed.    The following dose reduction techniques were utilized: automated  exposure control and/or adjustment of the mA and/or kV according to  patient size, and the use of iterative reconstruction technique.      COMPARISON: None available.    FINDINGS:    CT HEAD:   No acute intracranial hemorrhage. No intracranial mass, mass effect or  midline  shift. The ventricles are within normal size limits. Gray-white  matter differentiation is maintained. The posterior fossa is  unremarkable.    The orbits are unremarkable. The visualized paranasal sinuses and the  bilateral mastoid air cells are clear. The skull base and calvarium are  intact.    CT CERVICAL SPINE:  Alignment is maintained. Vertebral height is maintained. No suspicious  or destructive osseous lesions. The atlantooccipital and atlantoaxial  articulations are intact. The posterior elements are also intact. No  prevertebral soft tissue swelling. The  spinal canal and neural foramen  are patent at all cervical spine levels.      Impression:          1. No CT evidence of acute intracranial abnormality.    2. No acute fracture or subluxation in the cervical spine.    Gaylyn Rong, MD   03/24/2019 4:51 PM    CT Cervical Spine WO Contrast [[id number]] Collected: 03/24/19 1647    Order Status: Completed Updated: 03/24/19 1653    Narrative:      INDICATION: Fall.    TECHNIQUE: Axial noncontrast CT imaging through the head and cervical  spine performed, with sagittal and coronal reformats reviewed.    The following dose reduction techniques were utilized: automated  exposure control and/or adjustment of the mA and/or kV according to  patient size, and the use of iterative reconstruction technique.      COMPARISON: None available.    FINDINGS:    CT HEAD:   No acute intracranial hemorrhage. No intracranial mass, mass effect or  midline shift. The ventricles are within normal size limits. Gray-white  matter differentiation is maintained. The posterior fossa is  unremarkable.    The orbits are unremarkable. The visualized paranasal sinuses and the  bilateral mastoid air cells are clear. The skull base and calvarium are  intact.    CT CERVICAL SPINE:  Alignment is maintained. Vertebral height is maintained. No suspicious  or destructive osseous lesions. The atlantooccipital and atlantoaxial  articulations are intact. The posterior elements are also intact. No  prevertebral soft tissue swelling. The spinal canal and neural foramen  are patent at all cervical spine levels.      Impression:          1. No CT evidence of acute intracranial abnormality.    2. No acute fracture or subluxation in the cervical spine.    Gaylyn Rong, MD   03/24/2019 4:51 PM        Results     Procedure Component Value Units Date/Time    Lipase [[id number]] Collected: 03/24/19 1540    Specimen: Blood Updated: 03/24/19 1605     Lipase 31 U/L     GFR [[id number]] Collected: 03/24/19 1540      Updated: 03/24/19 1605     EGFR >60.0    Comprehensive metabolic panel [[id number]]  (Abnormal) Collected: 03/24/19 1540    Specimen: Blood Updated: 03/24/19 1605     Glucose 93 mg/dL      BUN 10.2 mg/dL      Creatinine 0.7 mg/dL      Sodium 725 mEq/L      Potassium 4.7 mEq/L      Chloride 103 mEq/L      CO2 26 mEq/L      Calcium 10.3 mg/dL      Protein, Total 8.0 g/dL      Albumin 4.5 g/dL      AST (SGOT) 52 U/L      ALT 112  U/L      Alkaline Phosphatase 71 U/L      Bilirubin, Total 0.3 mg/dL      Globulin 3.5 g/dL      Albumin/Globulin Ratio 1.3     Anion Gap 12.0    CBC and differential [[id number]]  (Abnormal) Collected: 03/24/19 1540    Specimen: Blood Updated: 03/24/19 1550     WBC 11.33 x10 3/uL      Hgb 14.8 g/dL      Hematocrit 54.0 %      Platelets 325 x10 3/uL      RBC 5.03 x10 6/uL      MCV 88.9 fL      MCH 29.4 pg      MCHC 33.1 g/dL      RDW 13 %      MPV 10.2 fL      Neutrophils 74.1 %      Lymphocytes Automated 18.5 %      Monocytes 6.5 %      Eosinophils Automated 0.1 %      Basophils Automated 0.4 %      Immature Granulocytes 0.4 %      Nucleated RBC 0.0 /100 WBC      Neutrophils Absolute 8.40 x10 3/uL      Lymphocytes Absolute Automated 2.10 x10 3/uL      Monocytes Absolute Automated 0.74 x10 3/uL      Eosinophils Absolute Automated 0.01 x10 3/uL      Basophils Absolute Automated 0.04 x10 3/uL      Immature Granulocytes Absolute 0.04 x10 3/uL      Absolute NRBC 0.00 x10 3/uL               Procedures    Clinical Impression & Disposition     Clinical Impression  Final diagnoses:   Fall, initial encounter   Compression fracture of T11 vertebra, initial encounter   Numbness and tingling        ED Disposition     ED Disposition Condition Date/Time Comment    Discharge Boarder to Home  Mon Mar 24, 2019  5:50 PM            Discharge Medication List as of 03/24/2019  5:50 PM      START taking these medications    Details   cyclobenzaprine (FLEXERIL) 10 MG tablet Take 1 tablet (10 mg total) by mouth 3 (three)  times daily as needed for Muscle spasms, Starting Mon 03/24/2019, [facility]                       Leticia Clas, MD  03/26/19 0202

## 2019-04-21 ENCOUNTER — Other Ambulatory Visit: Payer: Self-pay | Admitting: Orthopaedic Surgery of the Spine

## 2019-05-16 IMAGING — DX LUMBAR SPINE 2 VIEW
1 series · 3 of 3 positions shown · non-contrast
Comparison: none

LUMBAR SPINE 2 VIEW, THORACIC SPINE 2 VIEWS, 05/16/2019 [DATE]: 
CLINICAL INDICATION:  Fall in February 2019. T11 fracture. Evaluate for healing. 
COMPARISON EXAMINATIONS: None

[Series 1: AP · U · 0.14mm/px · 3 of 3 slices shown]
[im 1/3]
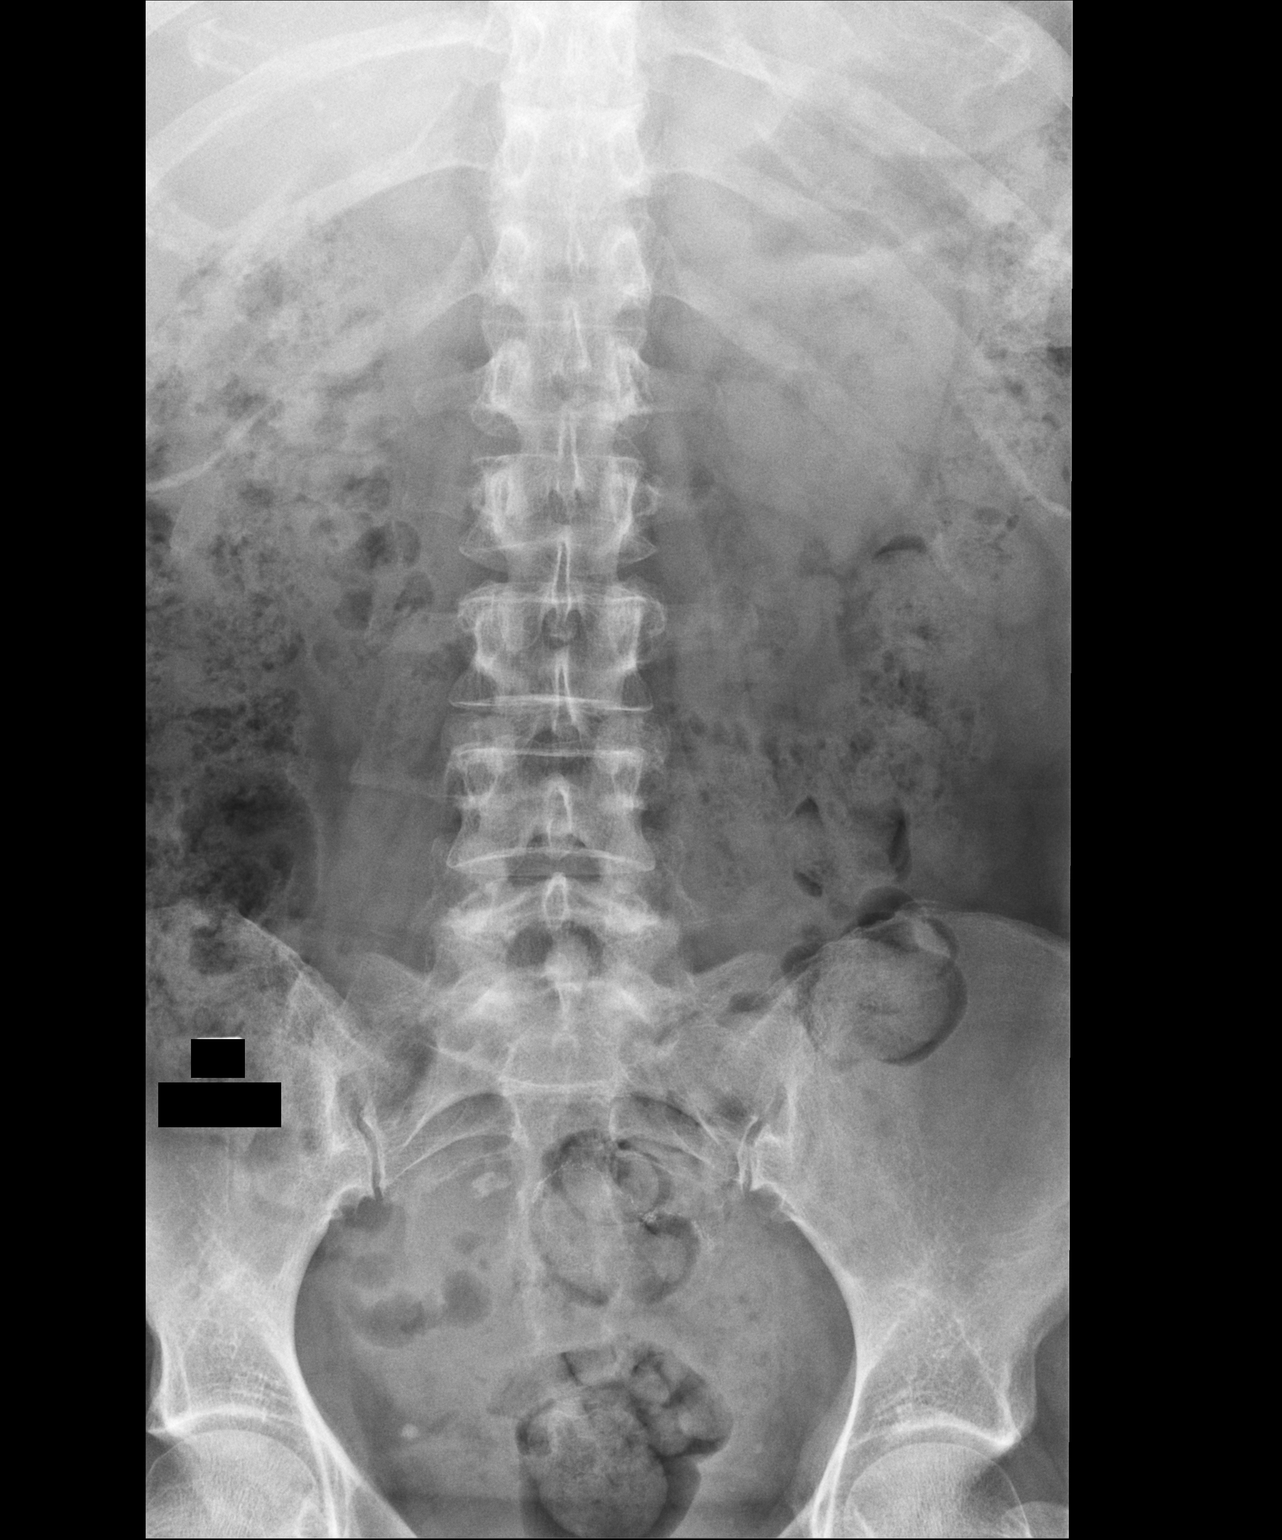
[im 2/3]
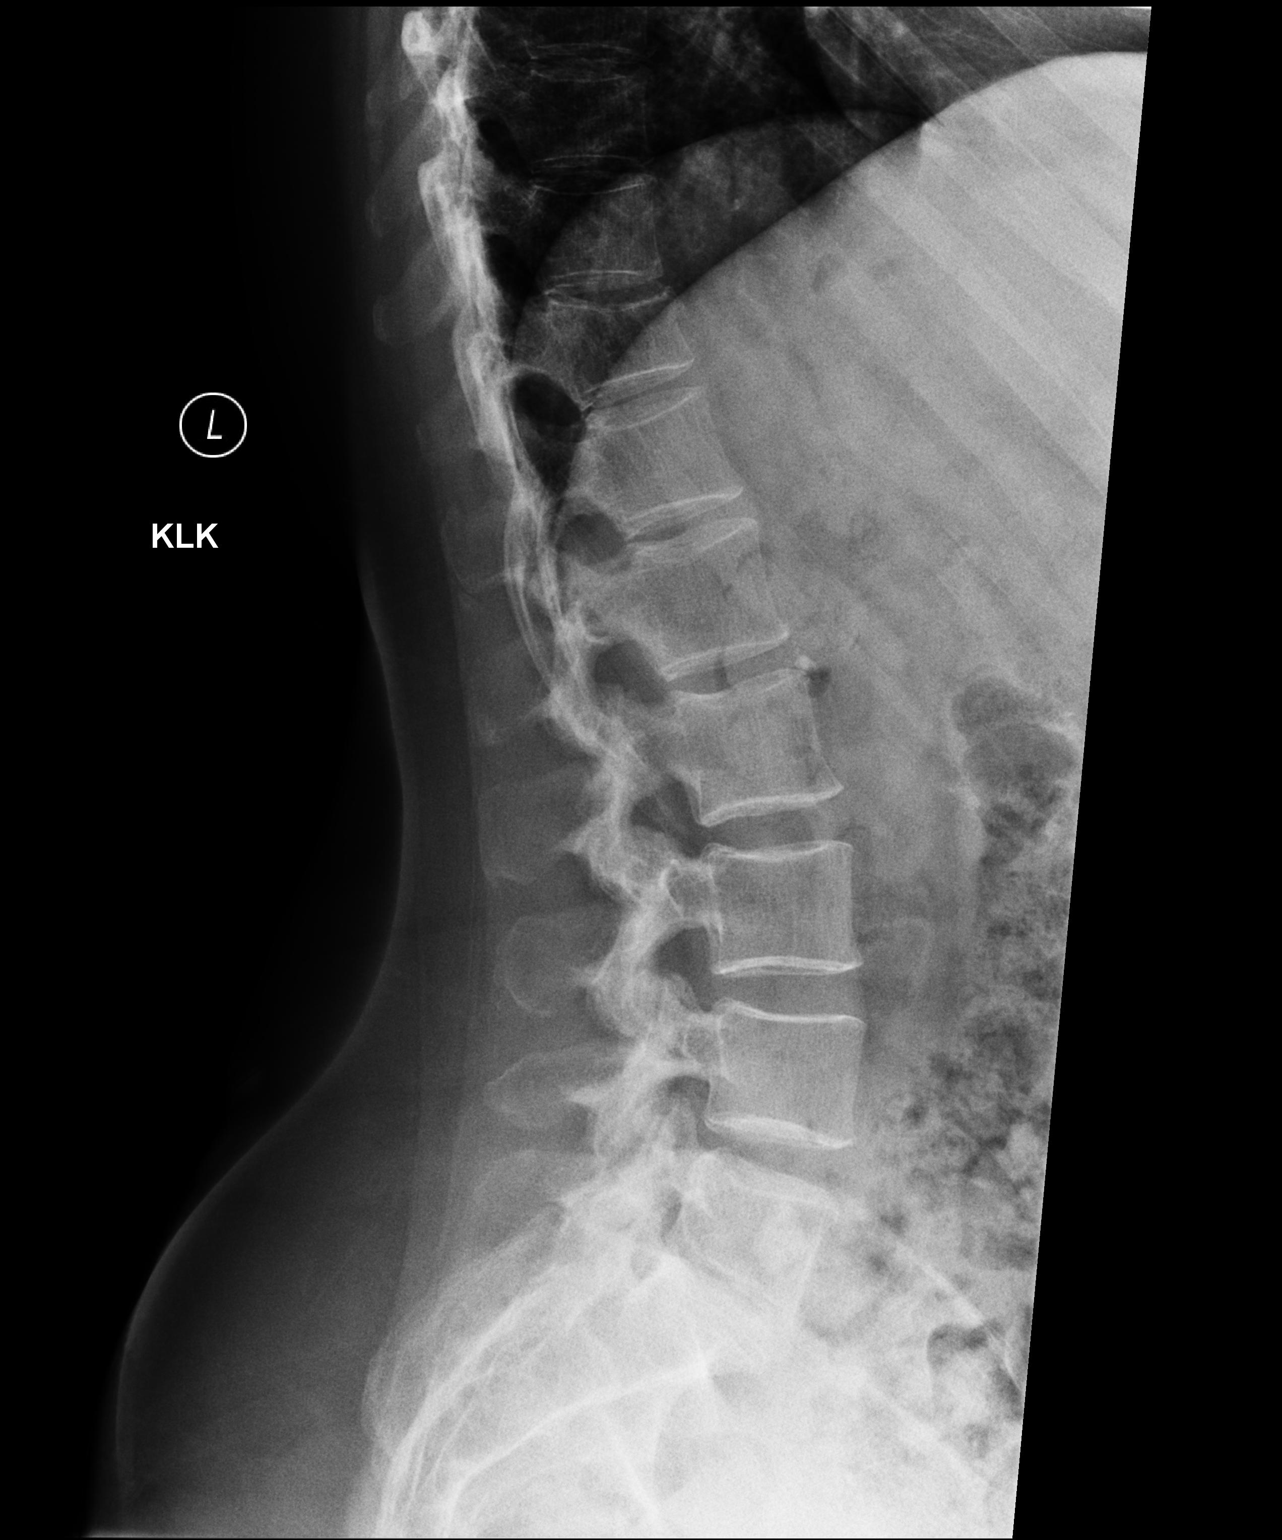
[im 3/3]
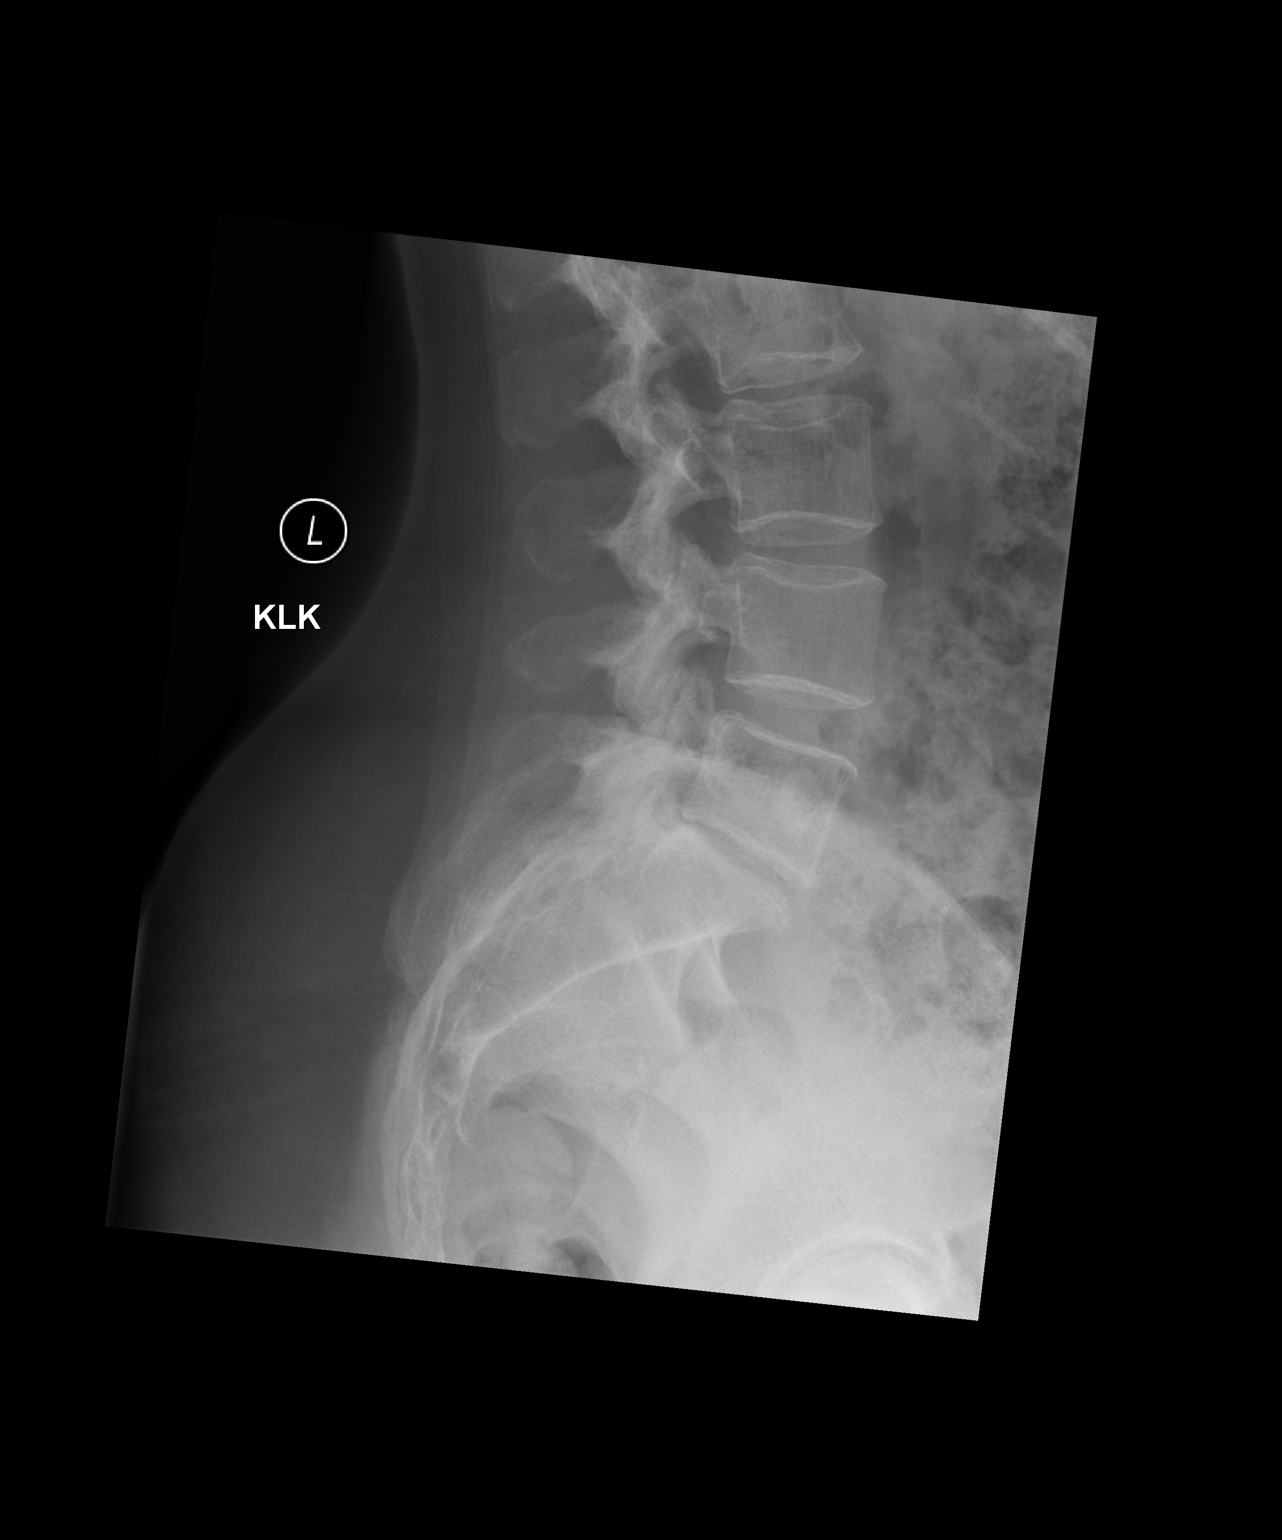

[3 of 3 positions shown; findings below may reference images not displayed]

FINDINGS: There are 5 lumbar-type vertebral bodies. There is fracture deformity 
of T11 correlating with patient history. There is approximately 25% loss of AP 
height. No endplate retropulsion. No additional fracture. Normal alignment. Disc 
spaces of the thoracic spine and lumbar spine are preserved. Included portions 
of the lungs are clear.
IMPRESSION: T11 fracture.

## 2019-05-16 IMAGING — DX THORACIC SPINE 2 VIEWS
1 series · 2 of 2 positions shown · non-contrast
Comparison: none

LUMBAR SPINE 2 VIEW, THORACIC SPINE 2 VIEWS, 05/16/2019 [DATE]: 
CLINICAL INDICATION:  Fall in February 2019. T11 fracture. Evaluate for healing. 
COMPARISON EXAMINATIONS: None

[Series 1: AP · U · 0.14mm/px · 2 of 2 slices shown]
[im 1/2]
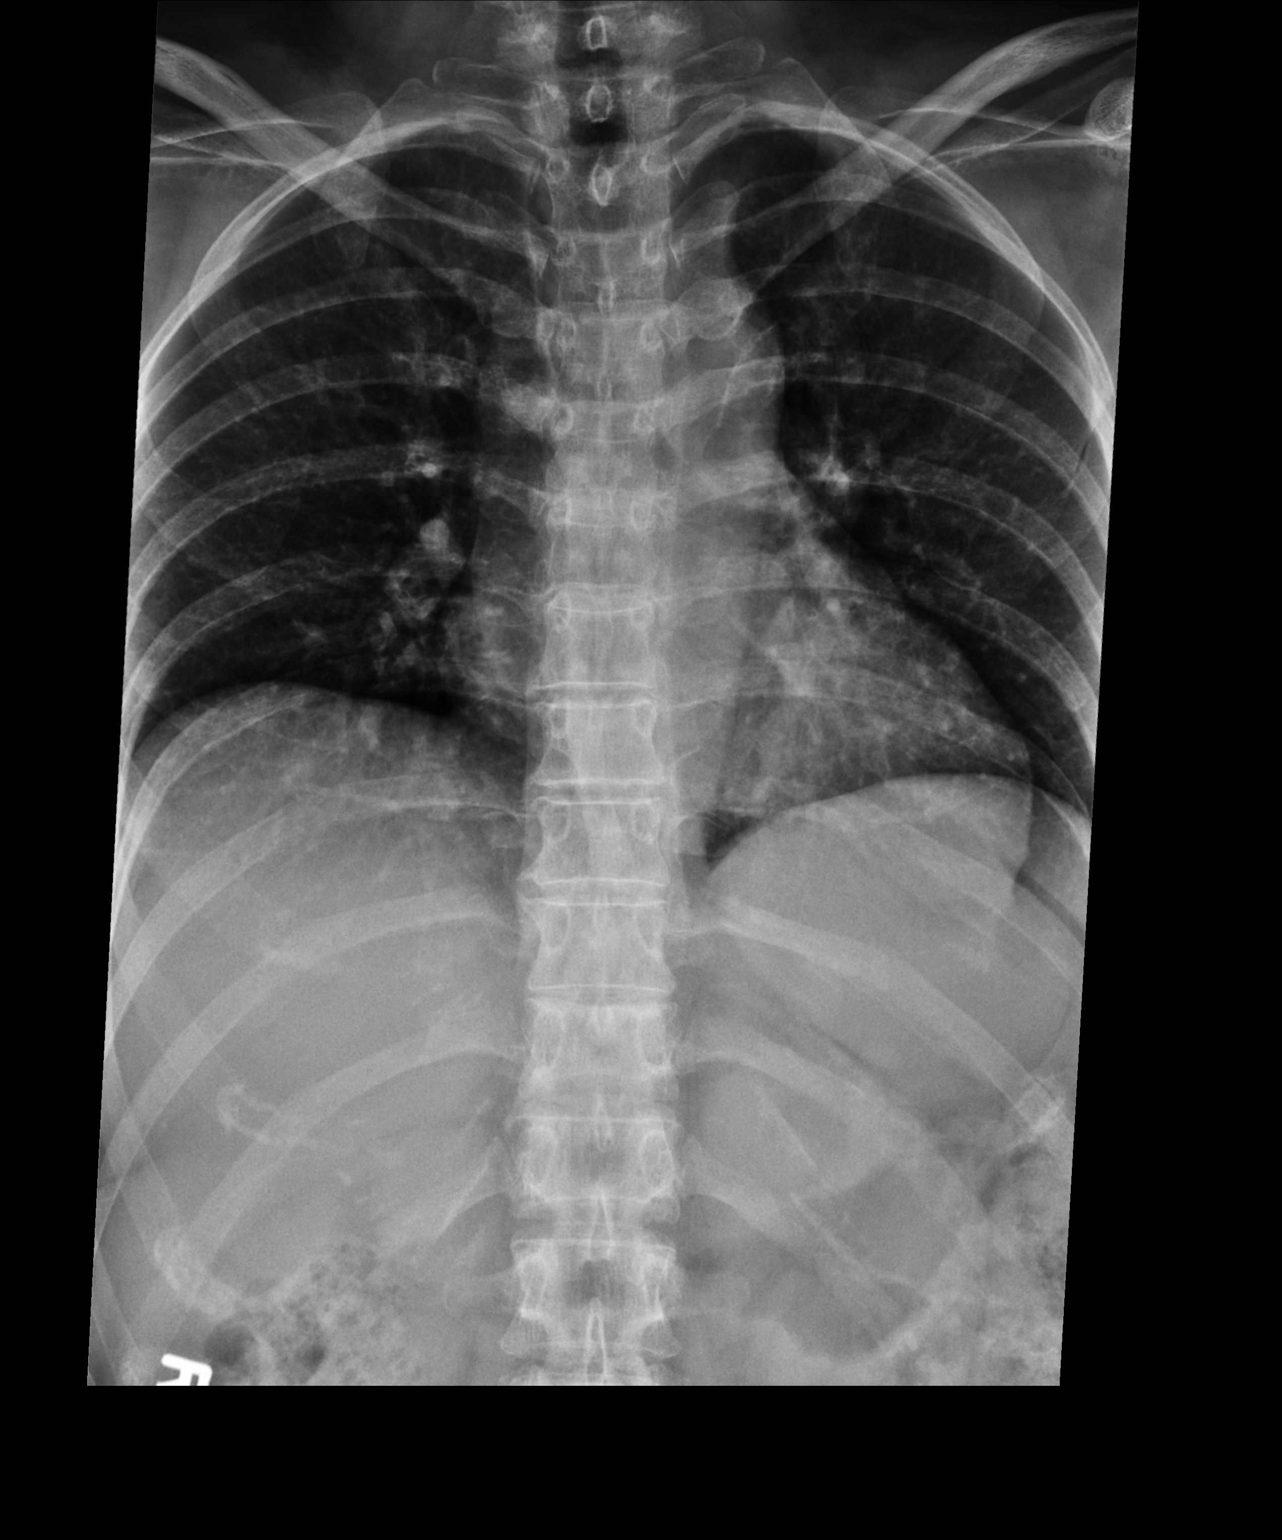
[im 2/2]
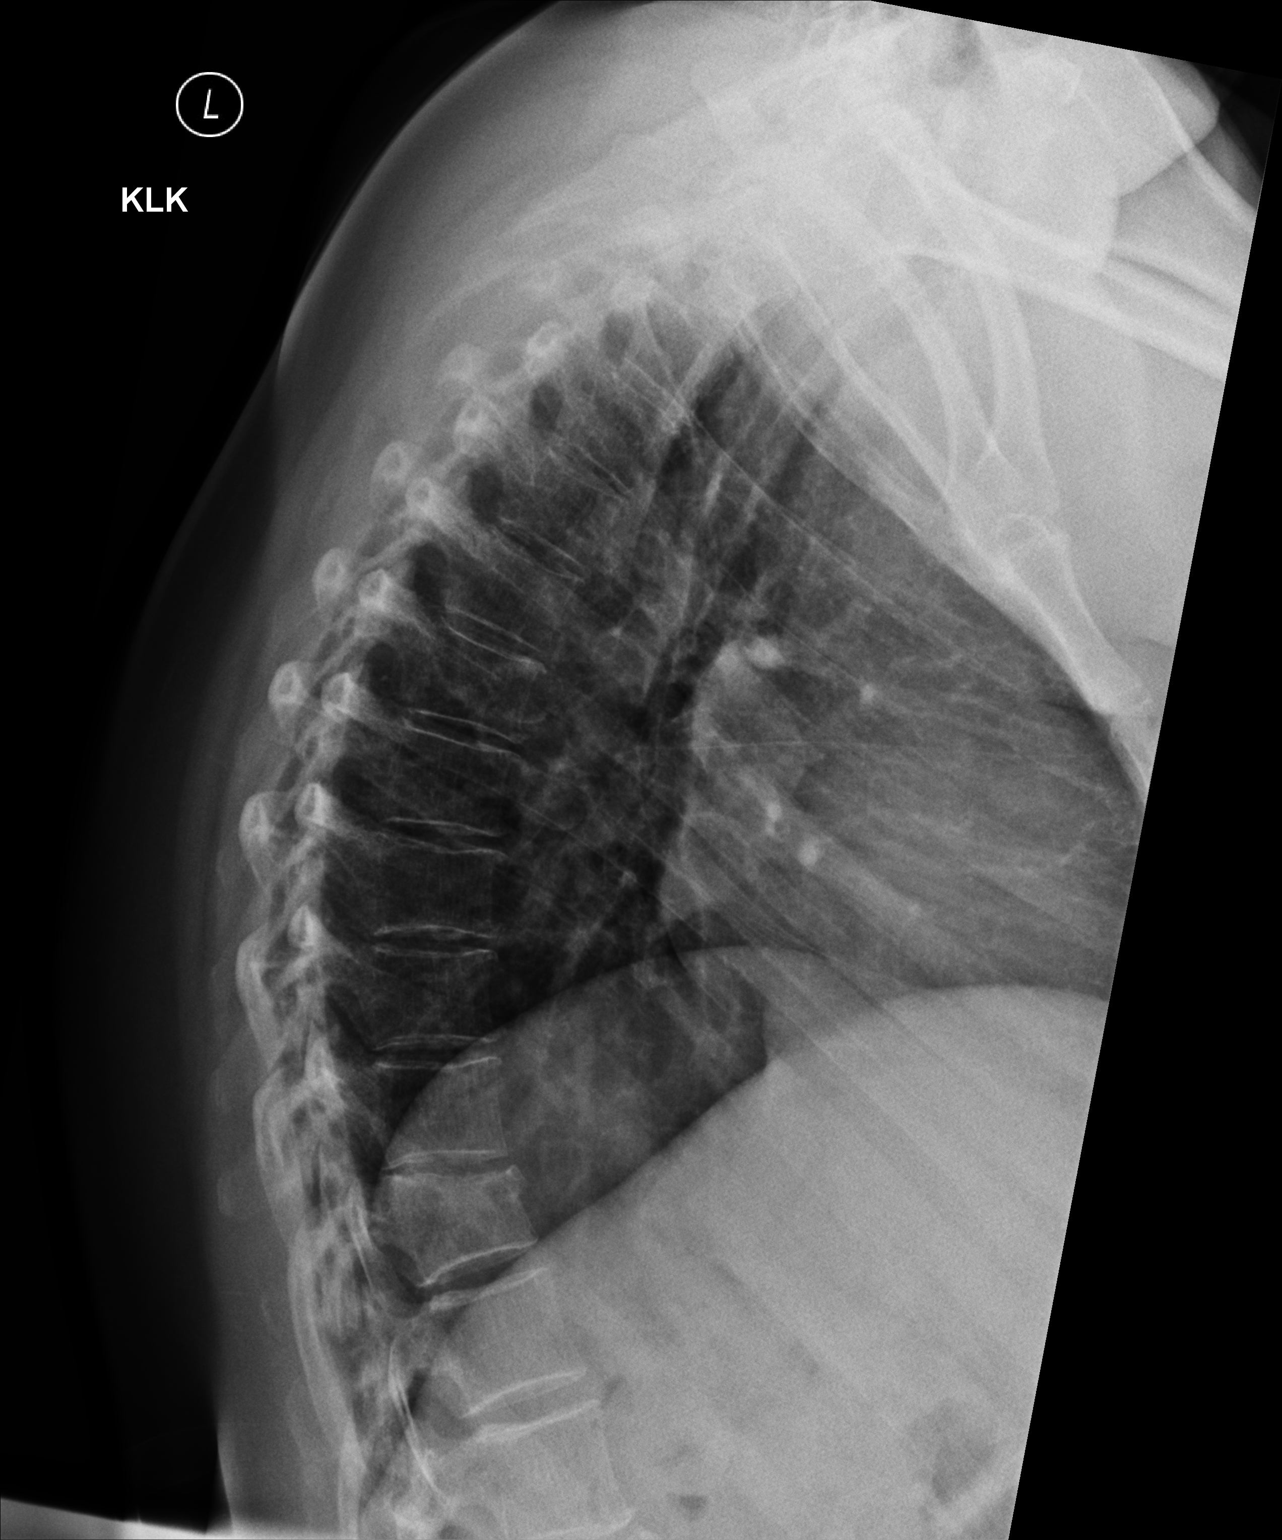

[2 of 2 positions shown; findings below may reference images not displayed]

FINDINGS: There are 5 lumbar-type vertebral bodies. There is fracture deformity 
of T11 correlating with patient history. There is approximately 25% loss of AP 
height. No endplate retropulsion. No additional fracture. Normal alignment. Disc 
spaces of the thoracic spine and lumbar spine are preserved. Included portions 
of the lungs are clear.
IMPRESSION: T11 fracture.

## 2019-06-19 IMAGING — MR MRI CERVICAL SPINE WITHOUT CONTRAST
5 series · 31 of 48 positions shown · IV contrast (gadolinium)
Comparison: Non-

MRI CERVICAL SPINE WITHOUT CONTRAST, 06/19/2019 [DATE]: 
CLINICAL INDICATION: Cervical radiculopathy, neck pain with bilateral arm pain. 
Status post fall February 2019
TECHNIQUE: Sagittal T1, Sagittal T2, Sagittal STIR, Axial TSE and Axial 1TFF4 
images of the cervical spine were performed without intravenous gadolinium 
enhancement.

[Series 201: survey · axial · 10.0mm · 0.94mm/px · z∈[-59,+104]mm · 5 of 9 slices shown]
[im 1/9]
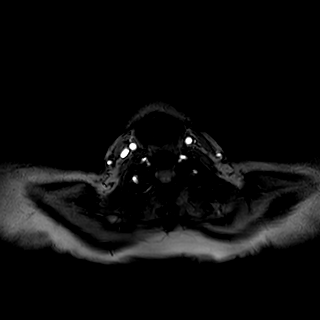
[im 3/9]
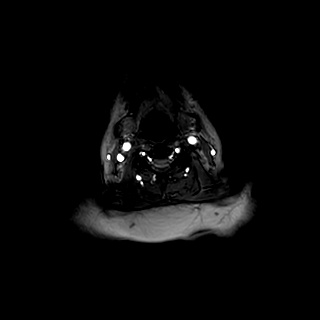
[im 5/9]
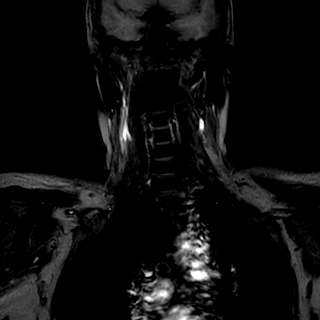
[im 7/9]
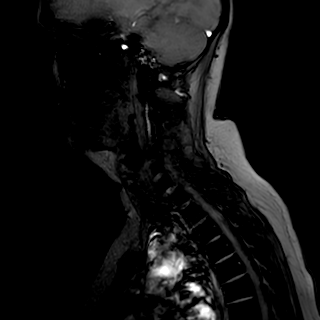
[im 9/9]
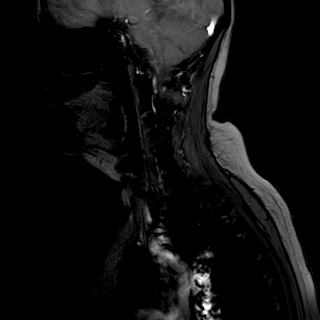

[Series 301: t1w_tse sag · sagittal · 3.0mm · 0.46mm/px · 7 of 15 slices shown]
[im 1/15]
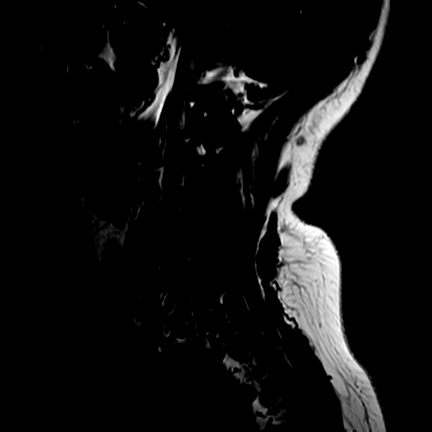
[im 3/15]
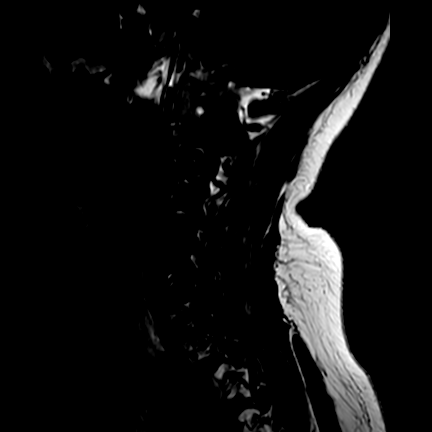
[im 5/15]
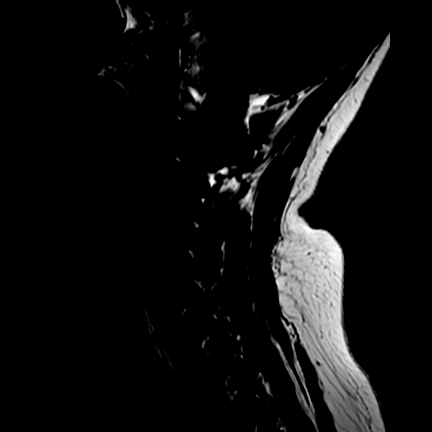
[im 8/15]
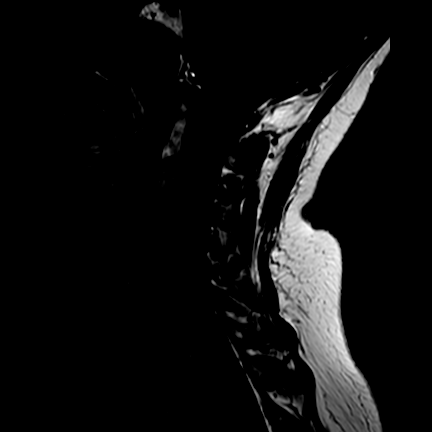
[im 10/15]
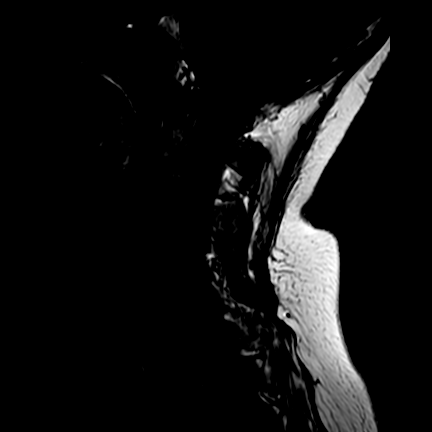
[im 12/15]
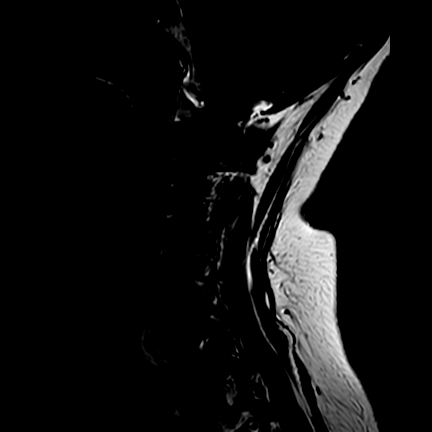
[im 15/15]
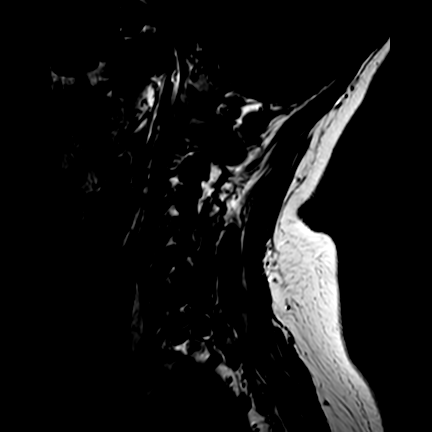

[Series 401: t2w_tse sag · sagittal · 3.0mm · 0.50mm/px · 7 of 15 slices shown]
[im 1/15]
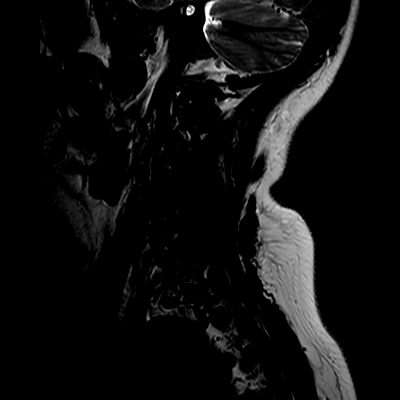
[im 3/15]
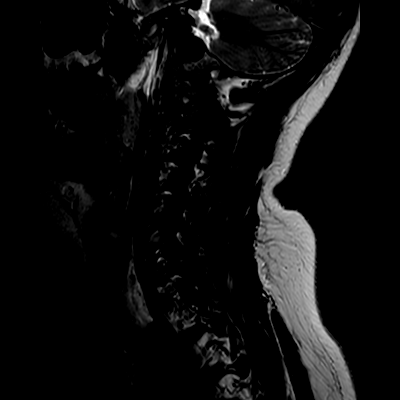
[im 5/15]
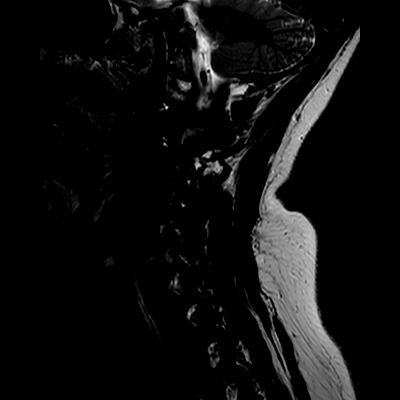
[im 8/15]
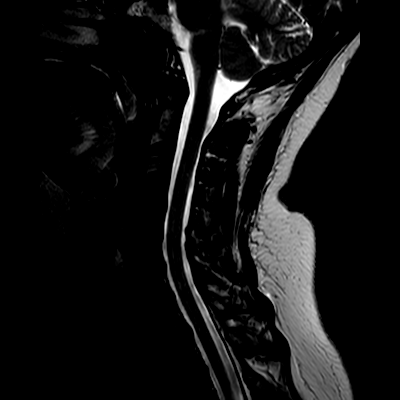
[im 10/15]
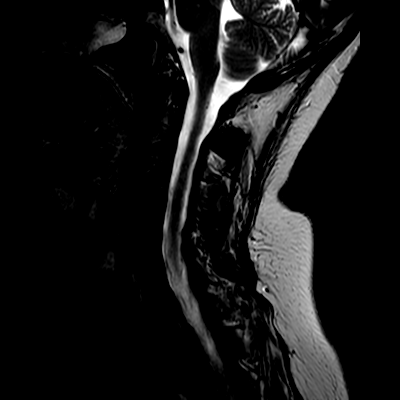
[im 12/15]
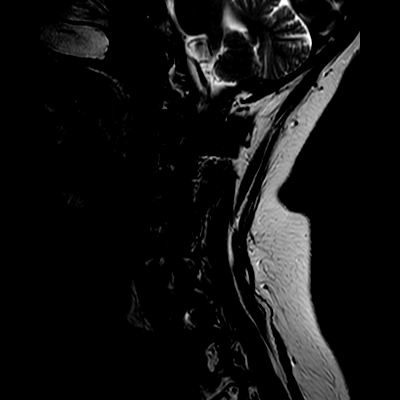
[im 15/15]
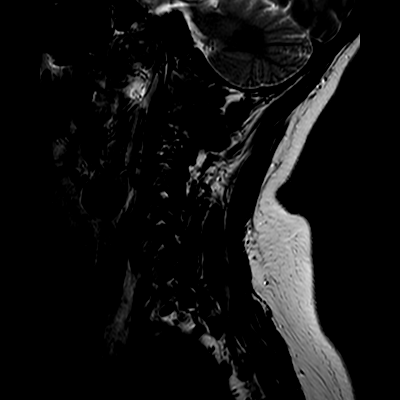

[Series 501: stir_longte sag · sagittal · 3.0mm · 0.50mm/px · 7 of 15 slices shown]
[im 1/15]
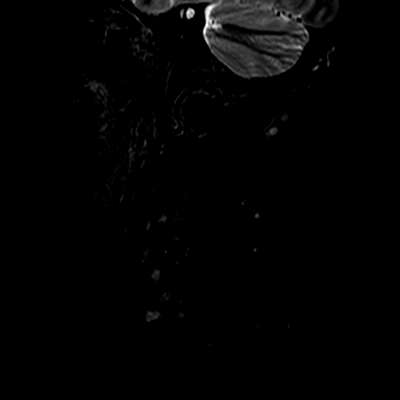
[im 3/15]
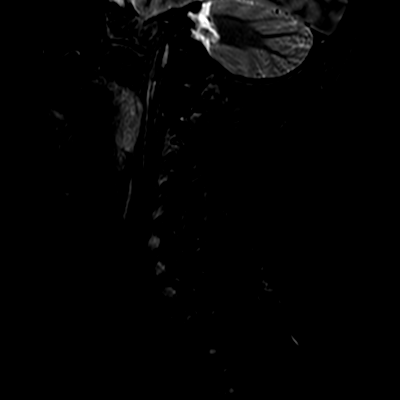
[im 5/15]
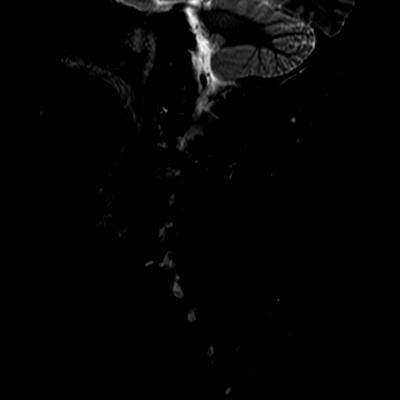
[im 8/15]
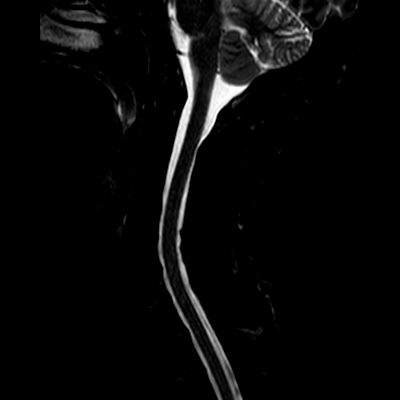
[im 10/15]
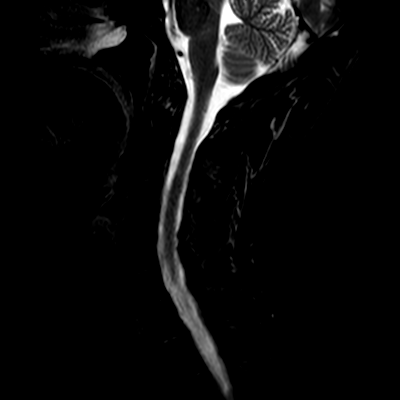
[im 12/15]
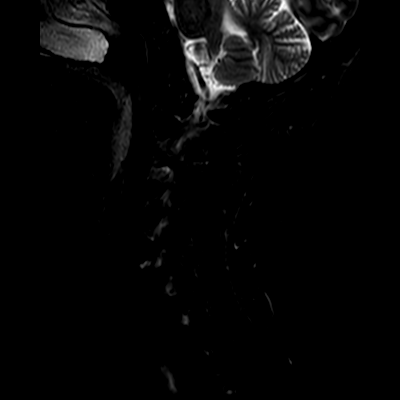
[im 15/15]
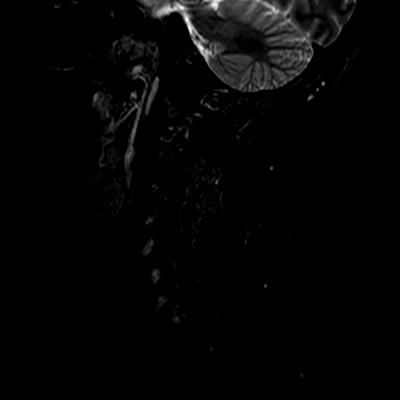

[Series 601: t2w_tse_ax · axial · 3.0mm · 0.36mm/px · z∈[-112,-45]mm · 5 of 45 slices shown]
[im 3/45]
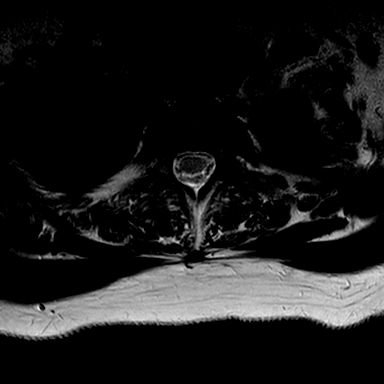
[im 9/45]
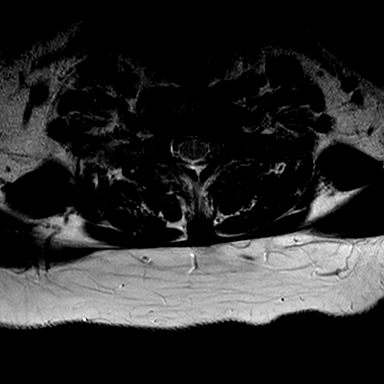
[im 13/45]
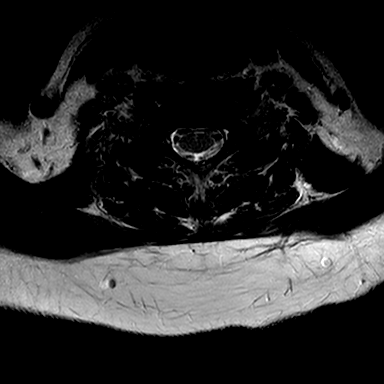
[im 19/45]
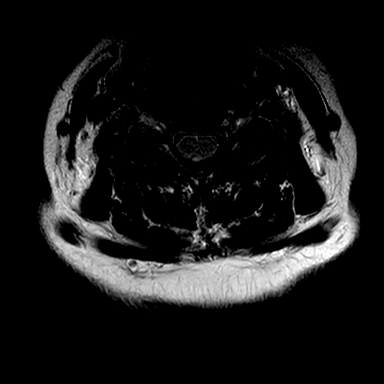
[im 26/45]
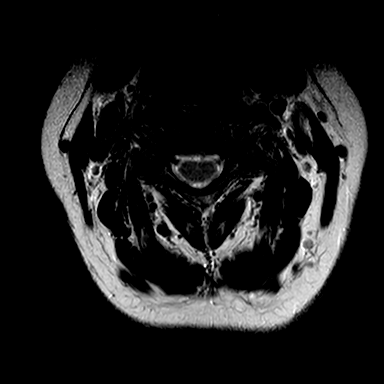

[31 of 48 positions shown; findings below may reference images not displayed]

FINDINGS: Cervical study is mildly limited by motion artifact. Vertebral heights 
are intact. Upper cervical lordosis is straightened. There is mild 
disc-osteophyte at C6-7 not touching the cord. There is borderline canal 
stenosis at C5-6 and C6-7. There is no intramedullary signal abnormality. 
The craniocervical junction is open. The dens and atlanto-dental interval are 
intact. Lower posterior fossa contents are unremarkable. There is no evidence 
for recent fracture or spinal malignancy. There are zygapophyseal facet 
degenerative changes. 
Axial images from C2-C3 through C7-T1 show mild to moderate right, mild left 
foraminal stenosis at C5-6. There appears to be mild to moderate left C6-7 
foraminal stenosis. Detail limited by motion artifact. No other significant 
foraminal impingement identified..
IMPRESSION: Spondylosis. There appears to be mild to moderate foraminal narrowing at C5-6 
and C6-7. Today's study is mildly limited by motion artifact. Cervical CT would 
be useful if indicated. 
There is mild disc-osteophyte at C6-7 slightly separated from the cord. Canal 
diameter is 10.5 mm, borderline stenotic. There is also borderline stenosis at 
C5-6. 
No cord signal abnormality. No evidence for fracture, subluxation or spinal 
malignancy. Straightening of the upper cervical lordosis could indicate muscle 
spasm.

## 2019-08-05 IMAGING — MR MRI LUMBAR SPINE WITHOUT CONTRAST
4 of 8 series · 15 of 48 positions shown · IV contrast (gadolinium)
Comparison: Thoracic and lumbar radiograph May 16, 2019

MRI LUMBAR SPINE WITHOUT CONTRAST, MRI THORACIC SPINE WITHOUT CONTRAST, 
08/05/2019 [DATE]: 
CLINICAL INDICATION: Spinal stenosis. Evaluate T11 compression fracture.
TECHNIQUE: Sagittal T1, Sagittal T2, Sagittal STIR, Axial T1 and Axial T2 MR 
images of the lumbar spine were performed without intravenous gadolinium 
enhancement.

[Series 101: survey · axial · 10.0mm · 1.39mm/px · z∈[-15,+199]mm · 2 of 9 slices shown]
[im 1/9]
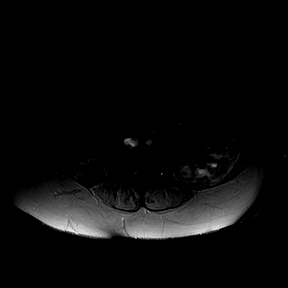
[im 9/9]
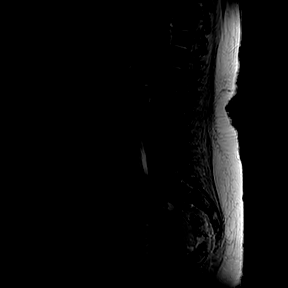

[Series 201: t2w_cor-surv · coronal · 6.0mm · 0.50mm/px · 1 of 5 slices shown]
[im 1/5]
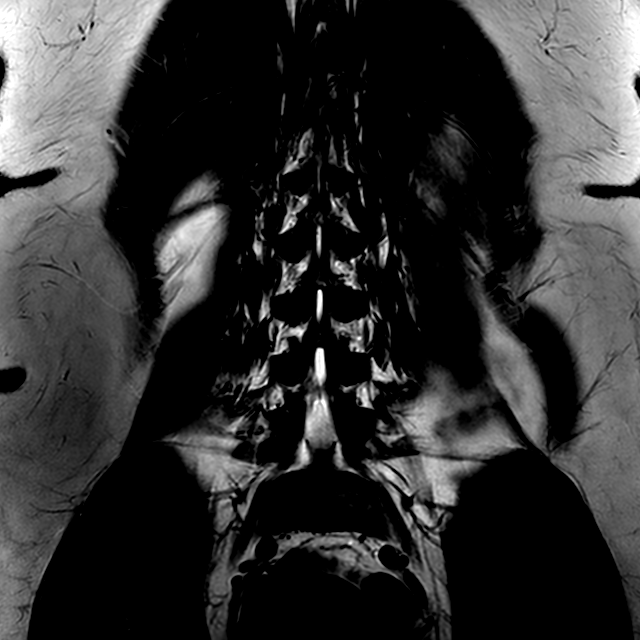

[Series 301: t2w_tse sag · sagittal · 4.0mm · 0.35mm/px · 4 of 17 slices shown]
[im 1/17]
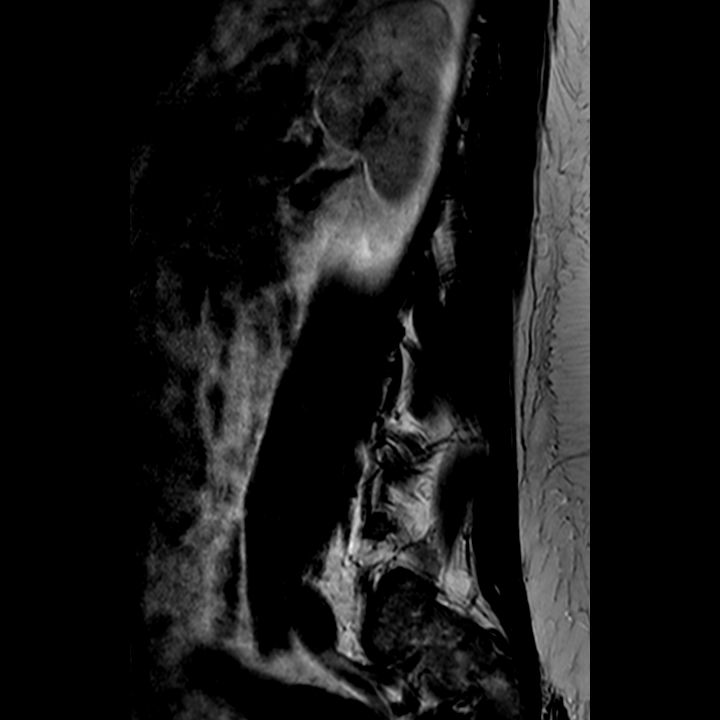
[im 5/17]
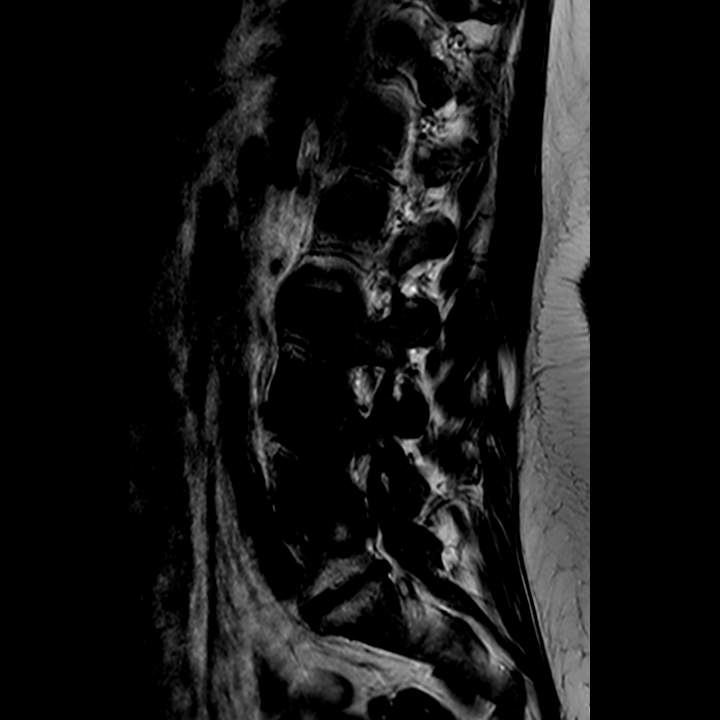
[im 9/17]
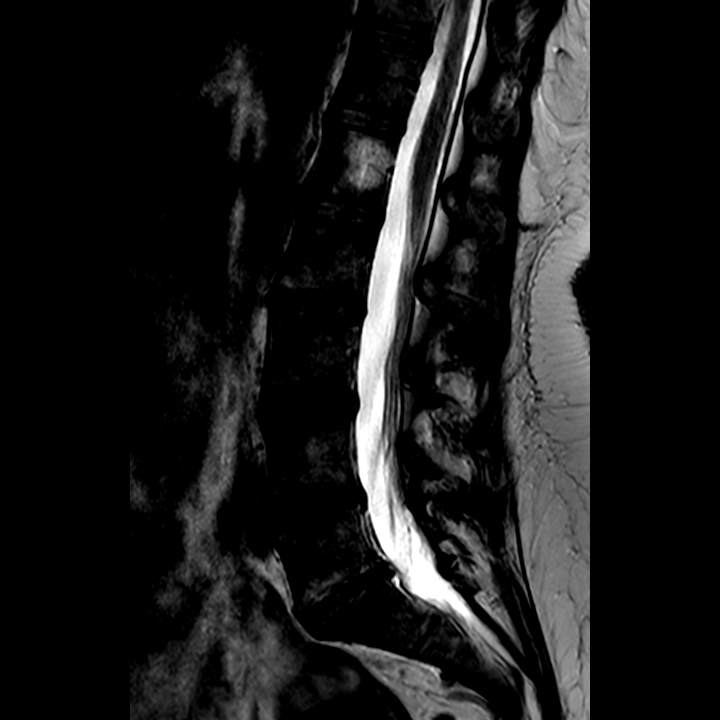
[im 17/17]
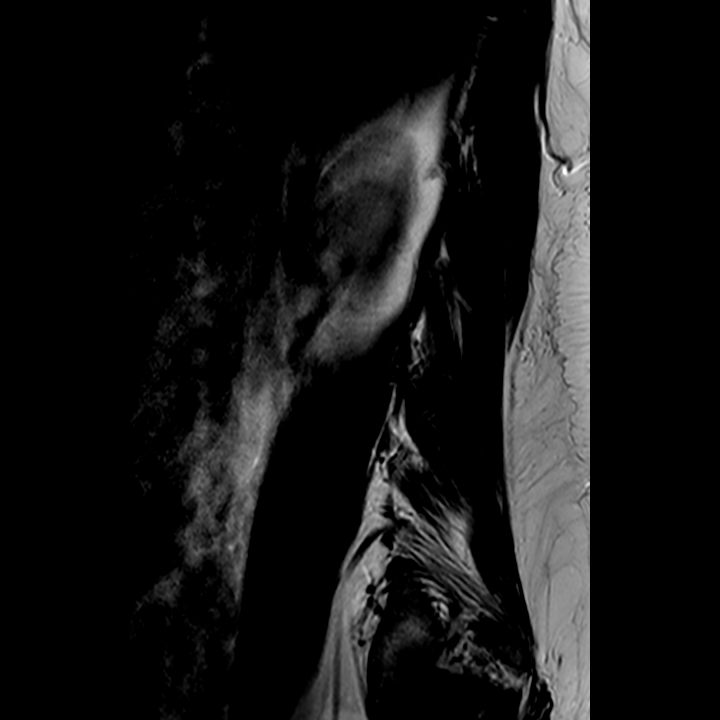

[Series 701: T1 · axial · 4.0mm · 0.35mm/px · z∈[-99,+106]mm · 8 of 35 slices shown]
[im 1/35]
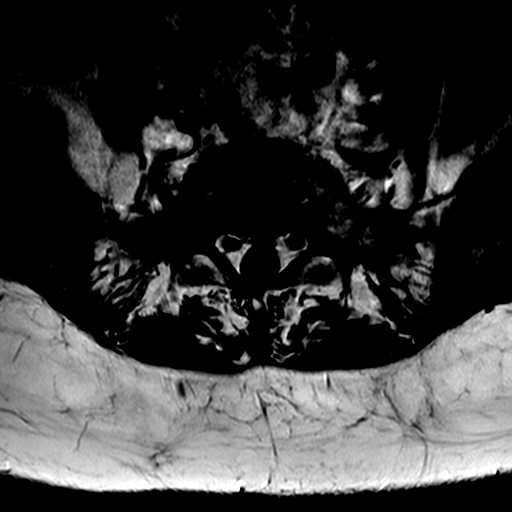
[im 4/35]
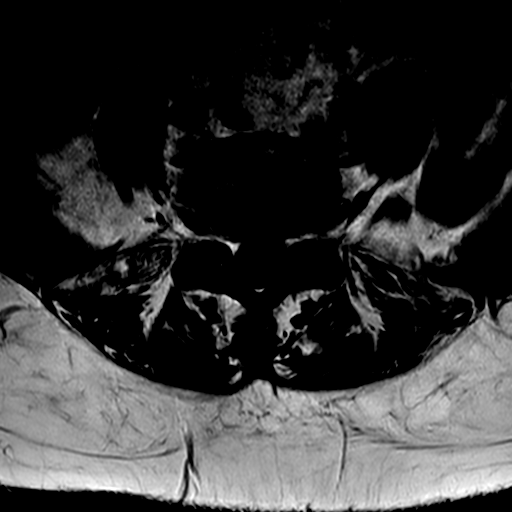
[im 12/35]
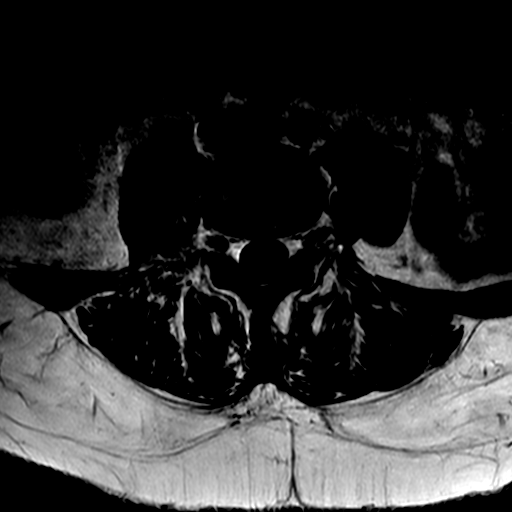
[im 16/35]
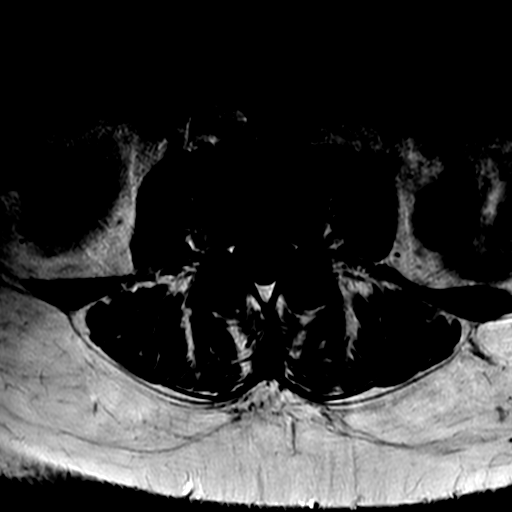
[im 19/35]
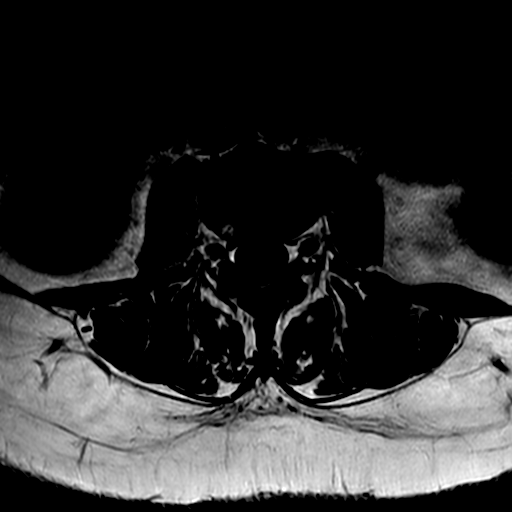
[im 23/35]
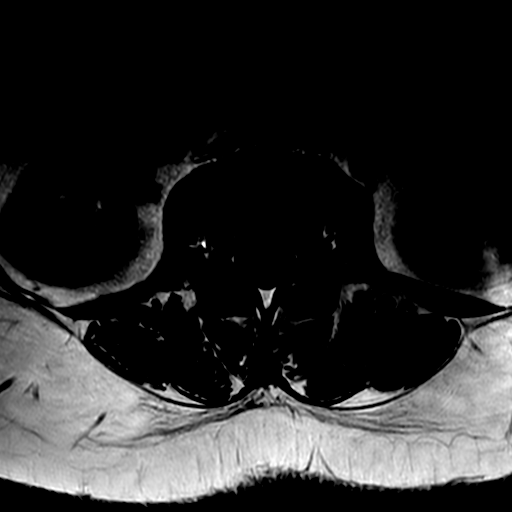
[im 31/35]
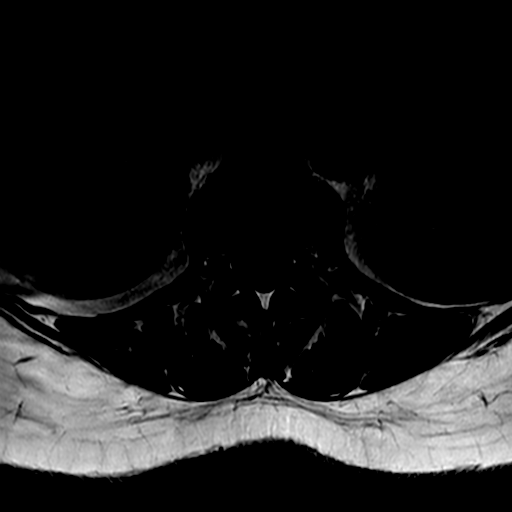
[im 35/35]
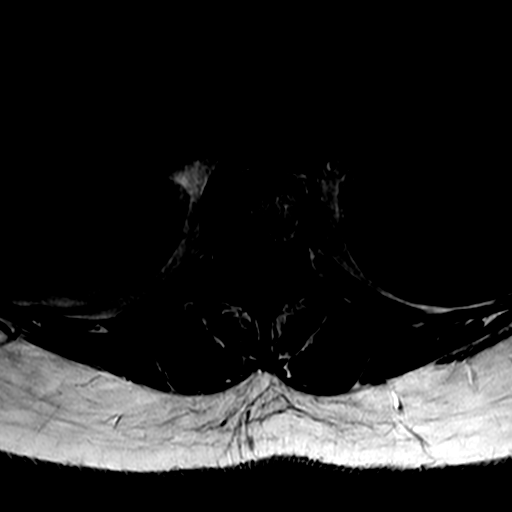

[15 of 48 positions shown; findings below may reference images not displayed]

FINDINGS: Lumbar vertebral heights are intact there is a Schmorl's node along 
the upper T11 endplate, with associated compression fracture extending to the 
anterior cortex. There is approximately 30% loss of height. There is edema along 
the upper endplate and surrounding the Schmorl's node which is likely of recent 
onset. There is no retropulsion. No other thoracic fracture. 
. There is no evidence for spinal malignancy. The sacrum is intact. Allowing for 
motion artifact cord signal appears normal. The conus is normal, terminating 
opposite L1. 
There are hemangiomas, largest at L1, also T9. 
There is no evidence for thoracic disc extrusion or significant canal stenosis. 
Foramina are open. 
There is marked disc narrowing at L5-S1 with small midline extrusion, not 
deforming the thecal sac or producing canal stenosis. There is mild right 
foraminal narrowing. There is reactive edema along the right interspace seen on 
sagittal STIR images. 
At L4-5 there is broad-based disc bulge with mild annular tear, contributing to 
mild canal stenosis. Foramina are open. 
The upper 3 lumbar levels show no significant stenosis.
IMPRESSION: Mild superior endplate compression fracture of T11, predominantly due to 
Schmorl's node. This appears to be of recent onset as source of discogenic pain. 
There is no retropulsion. This would be amenable to vertebral augmentation. 
No other thoracic or lumbar fracture. No evidence for malignancy. 
Mild disc bulge at L4-5 contributes to mild canal stenosis. No evidence for 
significant thoracic or lumbar stenosis. 
Marked disc narrowing at L5-S1 with small midline extrusion. This does not 
produce nerve impingement or stenosis. There is mild to moderate degenerative 
reactive endplate edema along the right L5-S1 interspace.

## 2019-08-05 IMAGING — MR MRI THORACIC SPINE WITHOUT CONTRAST
6 of 10 series · 18 of 48 positions shown · IV contrast (gadolinium)
Comparison: Thoracic and lumbar radiograph May 16, 2019

MRI LUMBAR SPINE WITHOUT CONTRAST, MRI THORACIC SPINE WITHOUT CONTRAST, 
08/05/2019 [DATE]: 
CLINICAL INDICATION: Spinal stenosis. Evaluate T11 compression fracture.
TECHNIQUE: Sagittal T1, Sagittal T2, Sagittal STIR, Axial T1 and Axial T2 MR 
images of the lumbar spine were performed without intravenous gadolinium 
enhancement.

[Series 101: survey · axial · 10.0mm · 1.39mm/px · z∈[-15,+199]mm · 2 of 9 slices shown]
[im 1/9]
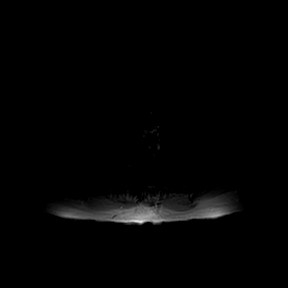
[im 9/9]
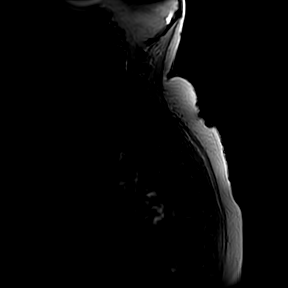

[Series 201: survey cor · sagittal · 10.0mm · 1.39mm/px · 3 of 10 slices shown]
[im 1/10]
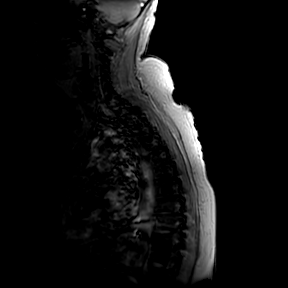
[im 5/10]
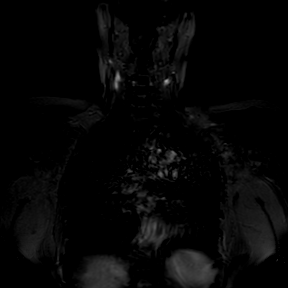
[im 10/10]
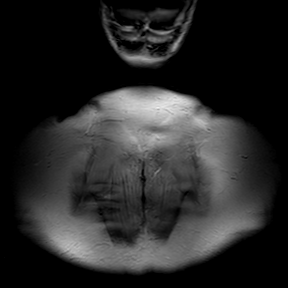

[Series 301: T1 · sagittal · 5.5mm · 0.66mm/px · 4 of 15 slices shown (1 of 3)]
[im 1/15]
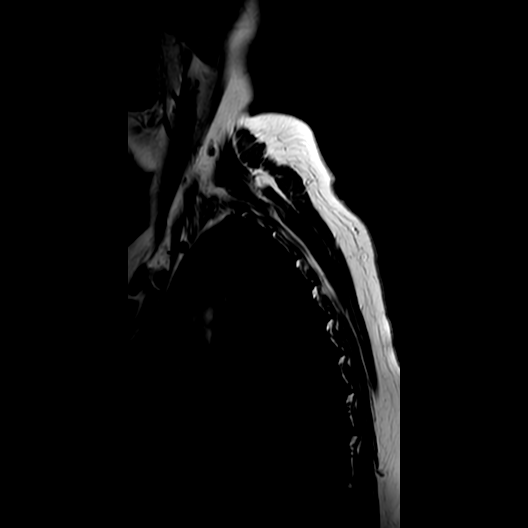
[im 5/15]
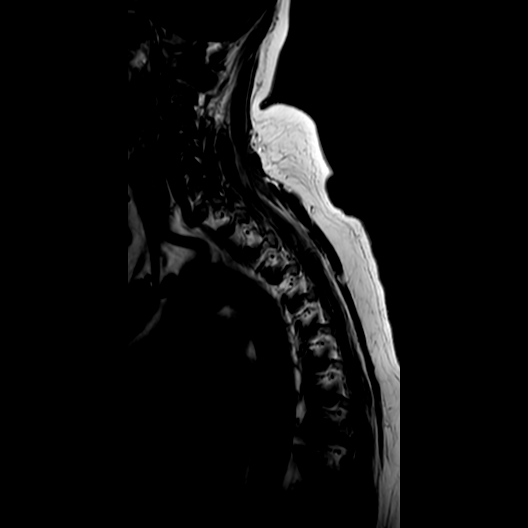
[im 10/15]
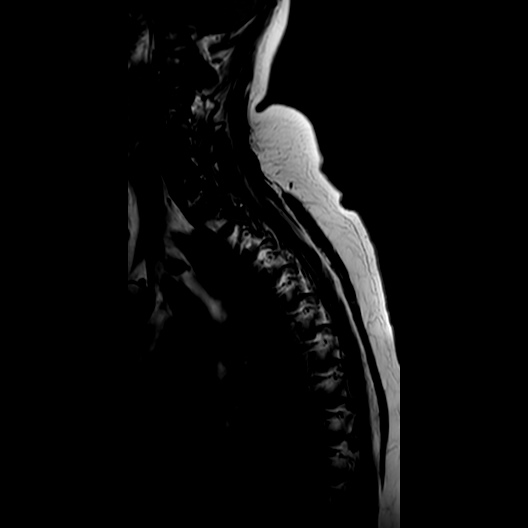
[im 15/15]
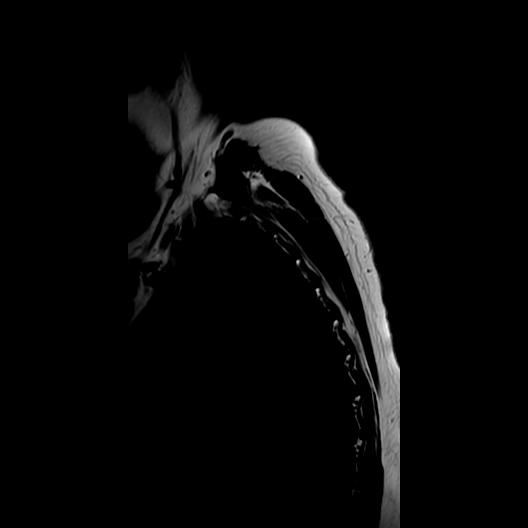

[Series 302: T1 · sagittal · 5.5mm · 0.66mm/px · 4 of 15 slices shown (2 of 3)]
[im 1/15]
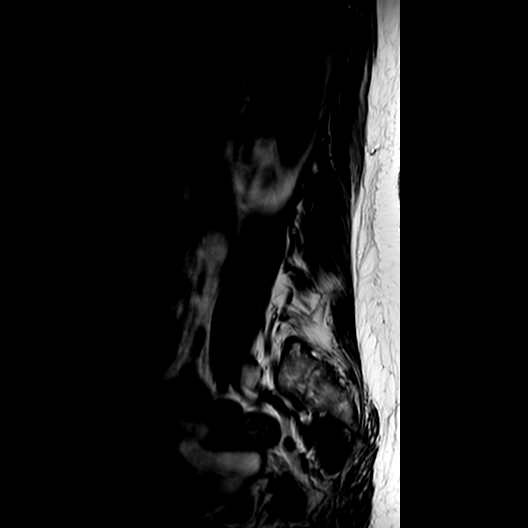
[im 5/15]
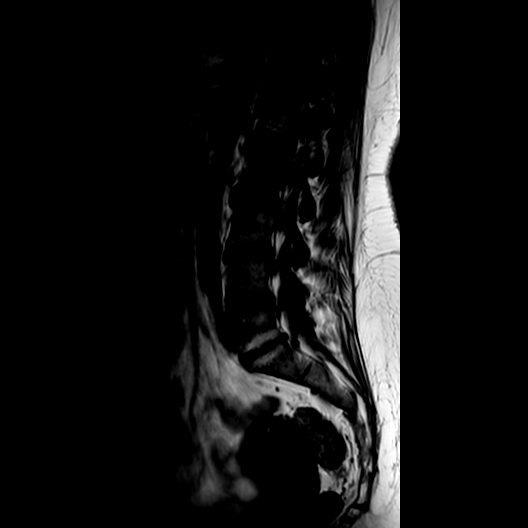
[im 10/15]
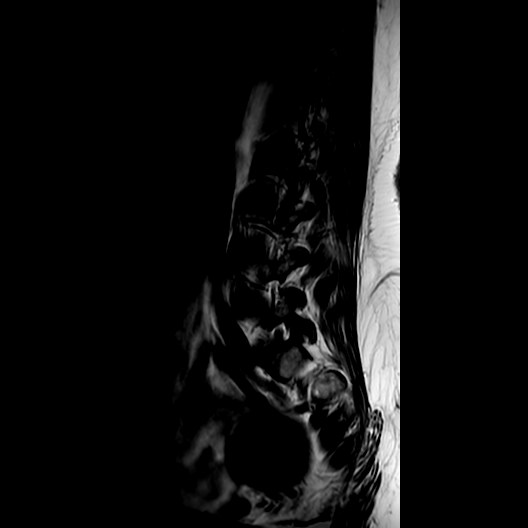
[im 15/15]
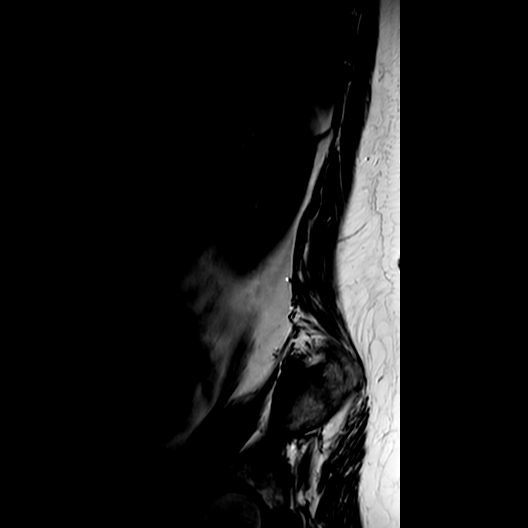

[Series 303: T1 · sagittal · 5.5mm · 0.66mm/px · 4 of 15 slices shown (3 of 3)]
[im 1/15]
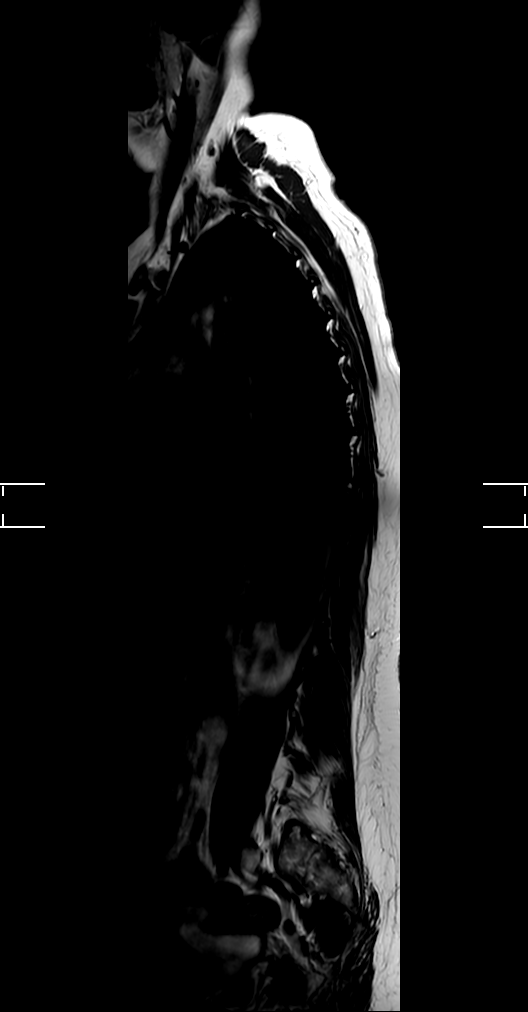
[im 5/15]
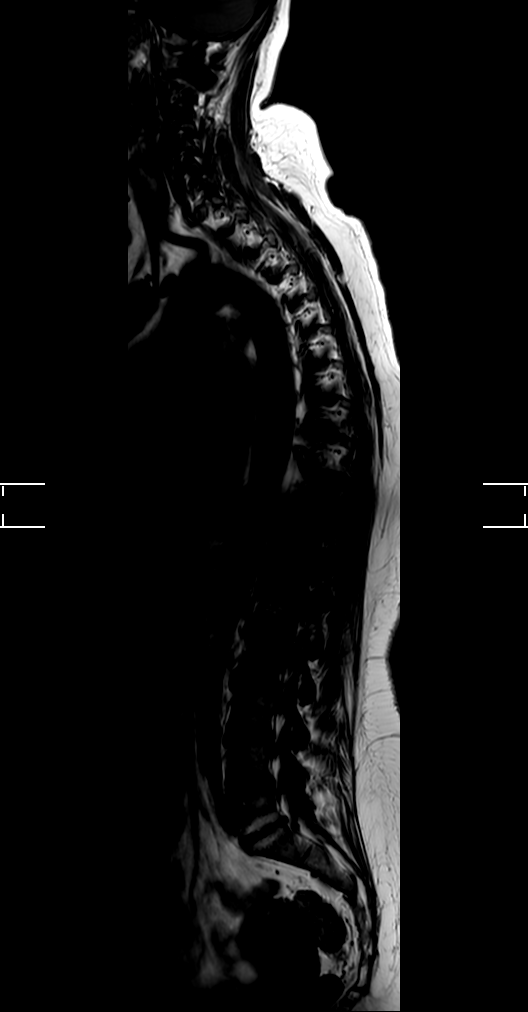
[im 10/15]
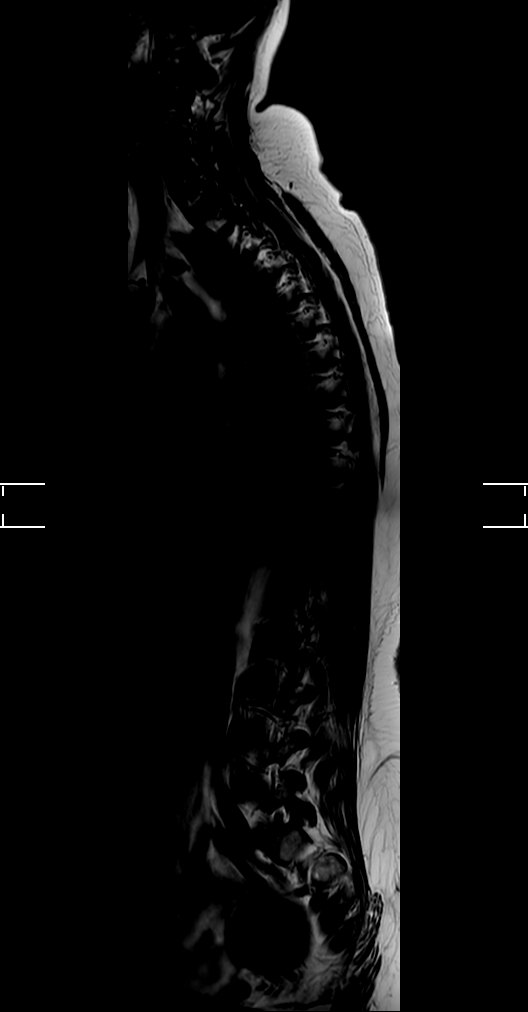
[im 15/15]
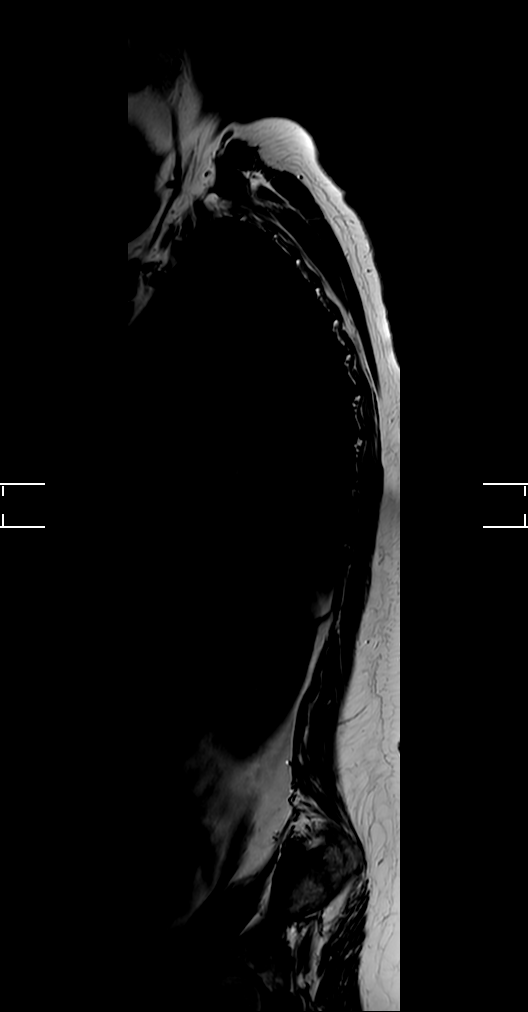

[Series 401: t1w_tse sag · sagittal · 3.0mm · 0.69mm/px · 1 of 21 slices shown]
[im 1/21]
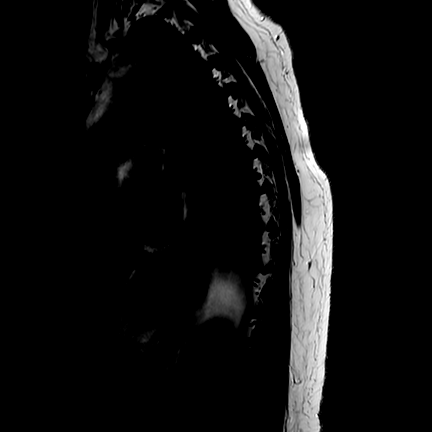

[18 of 48 positions shown; findings below may reference images not displayed]

FINDINGS: Lumbar vertebral heights are intact there is a Schmorl's node along 
the upper T11 endplate, with associated compression fracture extending to the 
anterior cortex. There is approximately 30% loss of height. There is edema along 
the upper endplate and surrounding the Schmorl's node which is likely of recent 
onset. There is no retropulsion. No other thoracic fracture. 
. There is no evidence for spinal malignancy. The sacrum is intact. Allowing for 
motion artifact cord signal appears normal. The conus is normal, terminating 
opposite L1. 
There are hemangiomas, largest at L1, also T9. 
There is no evidence for thoracic disc extrusion or significant canal stenosis. 
Foramina are open. 
There is marked disc narrowing at L5-S1 with small midline extrusion, not 
deforming the thecal sac or producing canal stenosis. There is mild right 
foraminal narrowing. There is reactive edema along the right interspace seen on 
sagittal STIR images. 
At L4-5 there is broad-based disc bulge with mild annular tear, contributing to 
mild canal stenosis. Foramina are open. 
The upper 3 lumbar levels show no significant stenosis.
IMPRESSION: Mild superior endplate compression fracture of T11, predominantly due to 
Schmorl's node. This appears to be of recent onset as source of discogenic pain. 
There is no retropulsion. This would be amenable to vertebral augmentation. 
No other thoracic or lumbar fracture. No evidence for malignancy. 
Mild disc bulge at L4-5 contributes to mild canal stenosis. No evidence for 
significant thoracic or lumbar stenosis. 
Marked disc narrowing at L5-S1 with small midline extrusion. This does not 
produce nerve impingement or stenosis. There is mild to moderate degenerative 
reactive endplate edema along the right L5-S1 interspace.

## 2019-11-19 IMAGING — MR MRI THORACIC SPINE WITHOUT CONTRAST
4 of 7 series · 12 of 48 positions shown · IV contrast (gadolinium)
Comparison: 08/05/2019 MRI and 05/16/2019 lumbar spine radiographs

MRI THORACIC SPINE WITHOUT CONTRAST, MRI LUMBAR SPINE WITHOUT CONTRAST, 
11/19/2019 [DATE]: 
CLINICAL INDICATION: New onset back pain for 2 [DATE] weeks. Rule out compression 
fractures.
TECHNIQUE: Multiplanar, multiecho position MR images of the thoracic spine were 
performed without intravenous gadolinium enhancement.

[Series 202: mobiview fused cor · coronal · 10.0mm · 0.88mm/px · 3 of 11 slices shown]
[im 1/11]
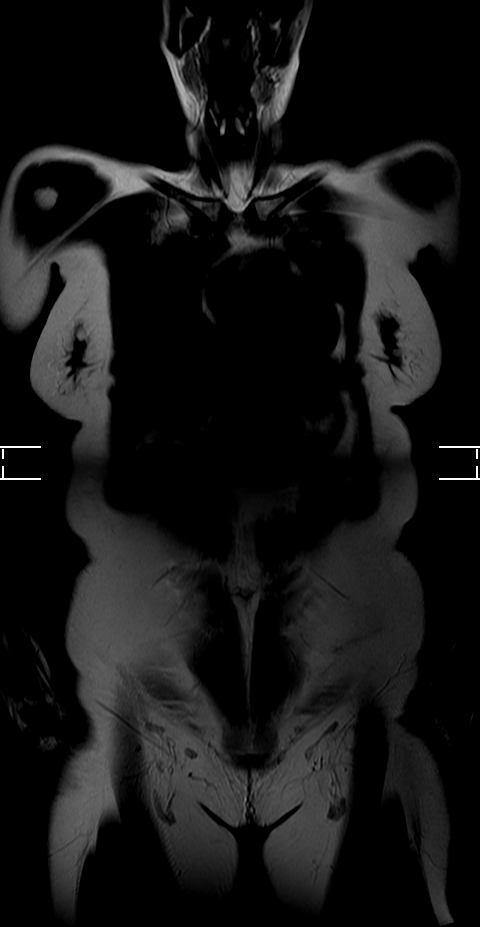
[im 7/11]
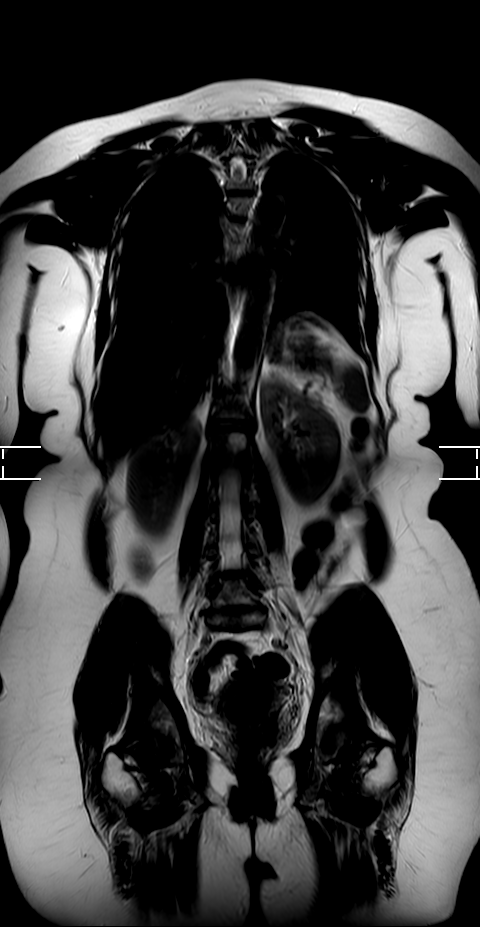
[im 11/11]
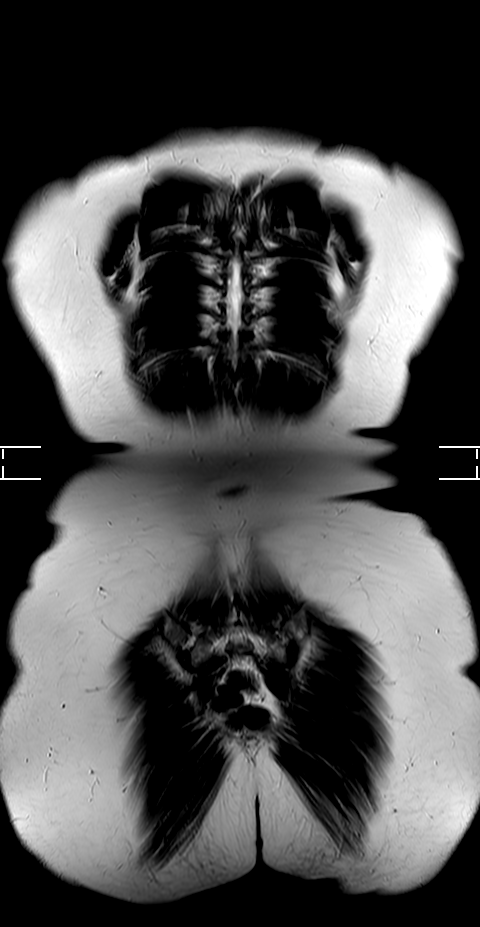

[Series 303: T1 · sagittal · 5.5mm · 0.66mm/px · 3 of 15 slices shown]
[im 1/15]
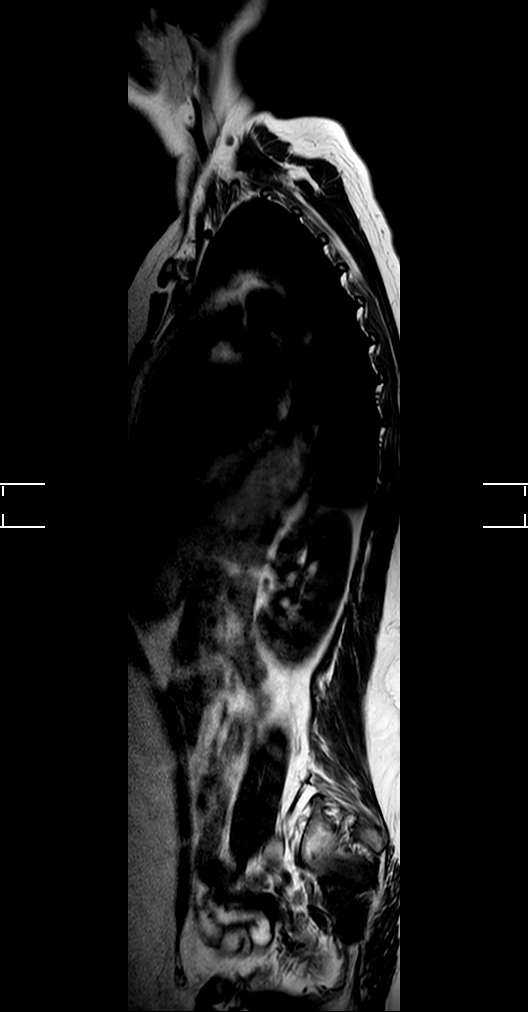
[im 8/15]
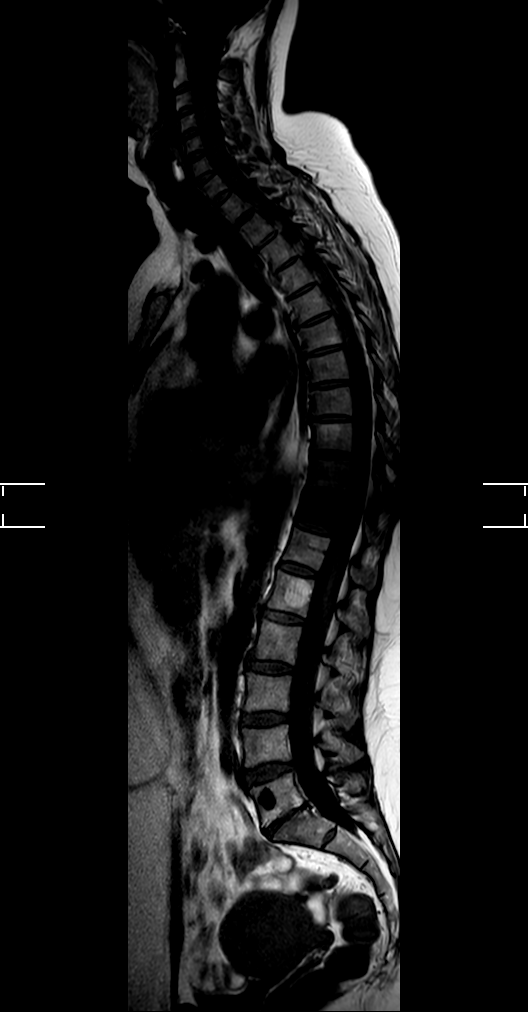
[im 15/15]
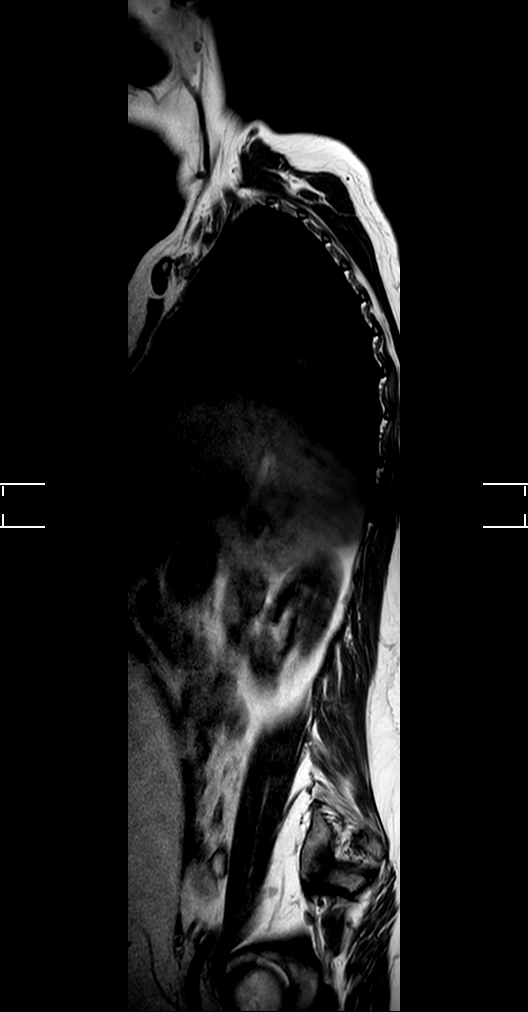

[Series 401: t1_tse_sag · sagittal · 3.0mm · 0.54mm/px · 3 of 21 slices shown]
[im 5/21]
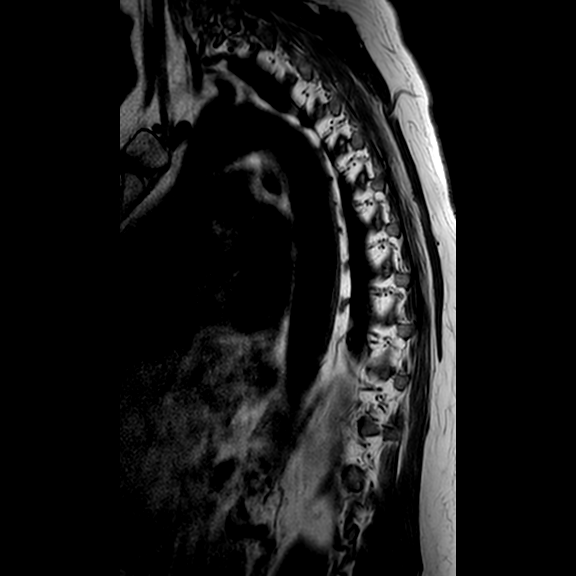
[im 13/21]
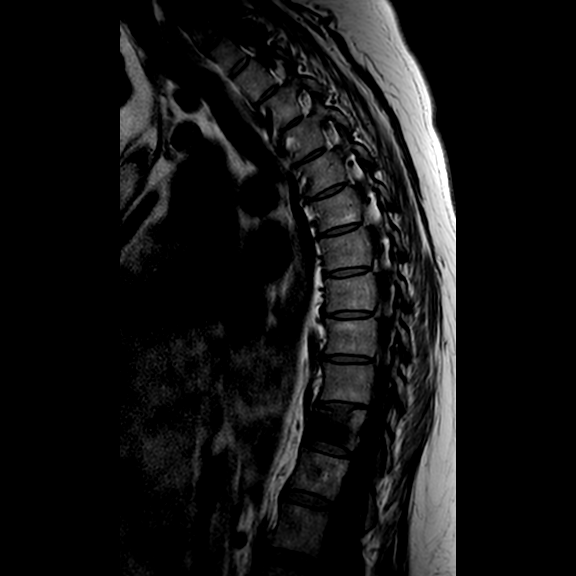
[im 21/21]
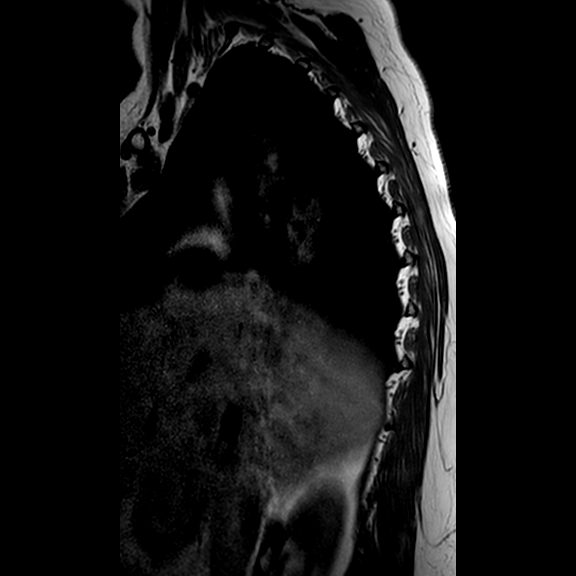

[Series 501: t2_tse_sag · sagittal · 3.0mm · 0.60mm/px · 3 of 21 slices shown]
[im 5/21]
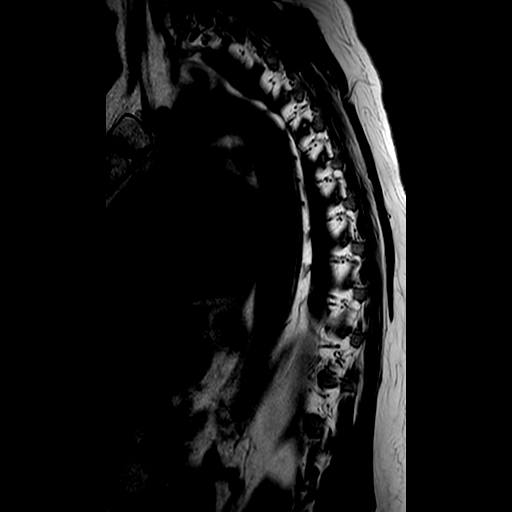
[im 13/21]
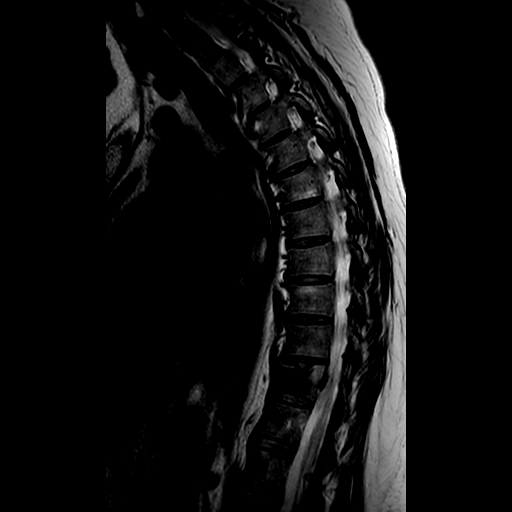
[im 21/21]
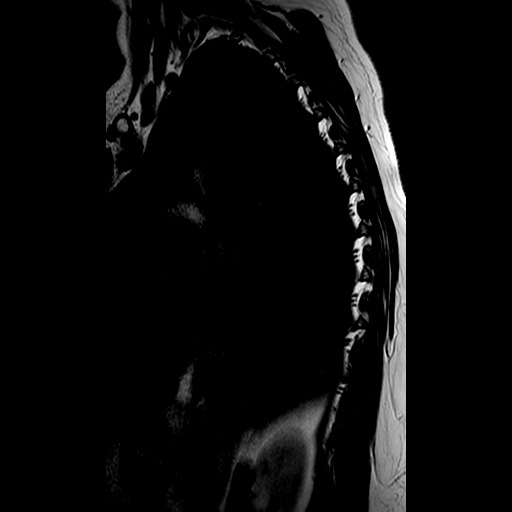

[12 of 48 positions shown; findings below may reference images not displayed]

FINDINGS: VERTEBRA: No acute fracture. Interval T11 vertebral body cement augmentation for 
moderate compression fracture, with residual marrow edema-like signal changes. 
Small amount of cement extends posterior to the T11 vertebral body, effaces the 
ventral thecal sac and abuts the cord, without central canal stenosis. 1.5 cm T9 
and 1.9 cm L1 vertebral body hemangiomas. Stable 1.6 cm signal void in the L5 
vertebral body, corresponding to densely sclerotic focus on radiographs: Finding 
is consistent with bone island. Multilevel degenerative change of the spine with 
disc space narrowing and anterior osteophytes. Mild costovertebral degenerative 
change. Type II Modic changes at L5-S1. No central canal, lateral recess or 
neural foraminal stenosis. 
SPINAL CORD: Spinal cord is normal in caliber and signal intensity. Conus 
medullaris is at the level of L1-2. 
THORACIC DISCS: Multilevel degenerative disc disease. No disc protrusions or 
evidence of discitis. 
L1-L2: The disc is normal in height and signal. No disc herniation. Normal 
facets. No spinal canal or neural foraminal stenosis. 
L2-L3: The disc is normal in height with disc desiccation. No disc herniation. 
Normal facets. No spinal canal or neural foraminal stenosis. 
L3-L4: The disc is normal in height with disc desiccation. No disc herniation. 
Normal facets. No spinal canal or neural foraminal stenosis. 
L4-L5: Stable mild broad-based disc bulge and central annular tear. The disc is 
normal in height with disc desiccation. No disc herniation. Mild facet 
arthropathy. No spinal canal or neural foraminal stenosis. 
L5-S1: Stable central disc extrusion, marked disc space narrowing anterior 
desiccation. Mild facet arthropathy and ligamentum flavum hypertrophy. No spinal 
canal or neural foraminal stenosis. 
SACRUM: No fractures, marrow edema-like signal changes or marrow replacing 
lesions. 
SI JOINTS: Mild degenerative change. 
ILIAC BONES: No fractures, marrow edema-like signal changes or marrow replacing 
lesions. 
SOFT TISSUES: The aorta is normal in diameter. No mass or fluid collection.
IMPRESSION: 1. No acute fractures. 
2. Interval T11 vertebral body cement augmentation for moderate compression 
fracture. Small amount of cement extends posterior to the T11 vertebral body, 
effaces the ventral thecal sac and abuts the cord, without central canal 
stenosis. 
3. Stable multilevel degenerative change, L4-L5 mild broad-based disc bulge and 
central annular tear, and L5-S1 central disc extrusion.

## 2019-11-19 IMAGING — MR MRI LUMBAR SPINE WITHOUT CONTRAST
4 of 7 series · 20 of 48 positions shown · IV contrast (gadolinium)
Comparison: 08/05/2019 MRI and 05/16/2019 lumbar spine radiographs

MRI THORACIC SPINE WITHOUT CONTRAST, MRI LUMBAR SPINE WITHOUT CONTRAST, 
11/19/2019 [DATE]: 
CLINICAL INDICATION: New onset back pain for 2 [DATE] weeks. Rule out compression 
fractures.
TECHNIQUE: Multiplanar, multiecho position MR images of the thoracic spine were 
performed without intravenous gadolinium enhancement.

[Series 801: survey · axial · 10.0mm · 1.04mm/px · z∈[-347,-135]mm · 3 of 10 slices shown]
[im 1/10]
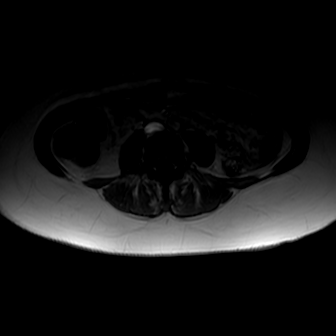
[im 5/10]
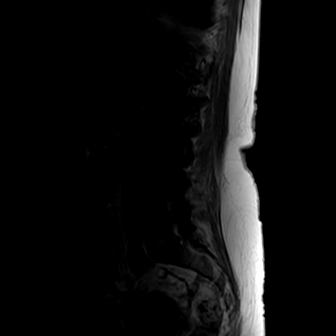
[im 10/10]
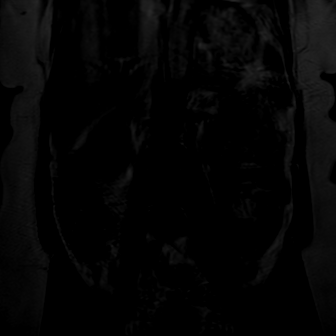

[Series 901: t2w_cor-surv · coronal · 6.0mm · 0.60mm/px · 2 of 5 slices shown]
[im 1/5]
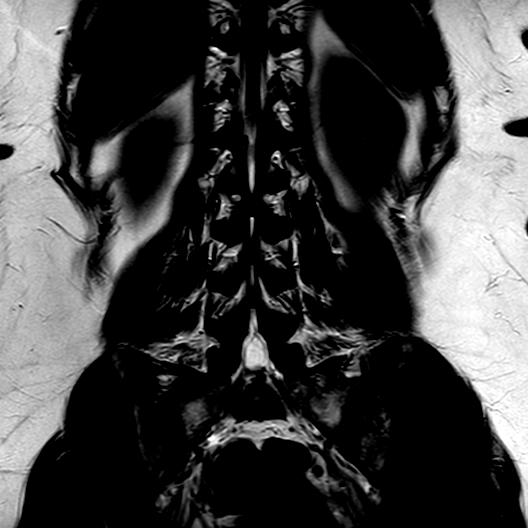
[im 5/5]
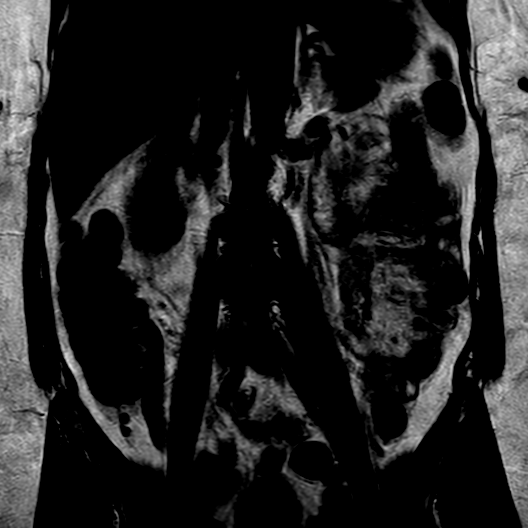

[Series 1001: t1_tse_sag · sagittal · 4.0mm · 0.44mm/px · 6 of 15 slices shown]
[im 1/15]
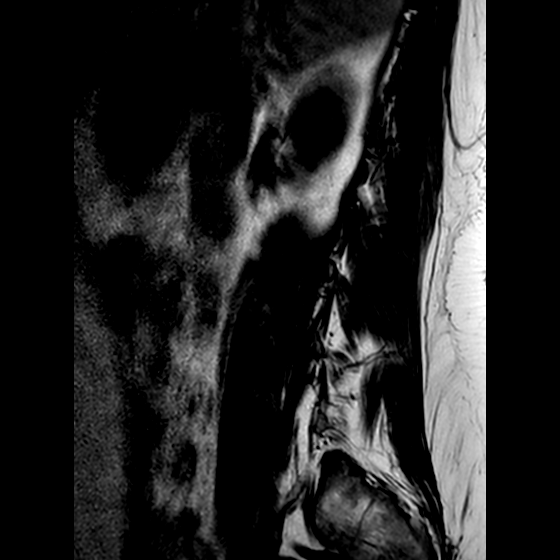
[im 3/15]
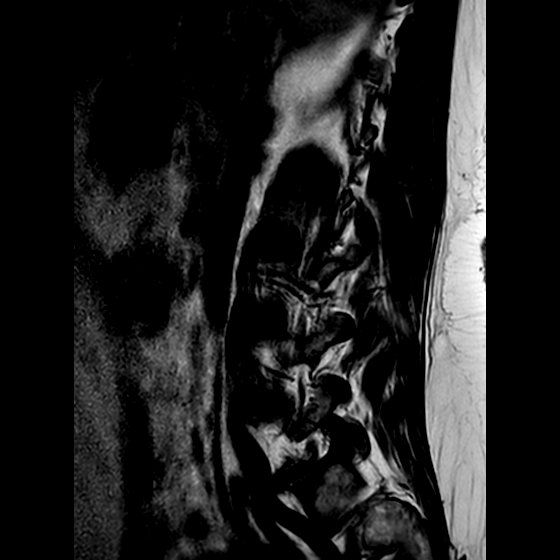
[im 6/15]
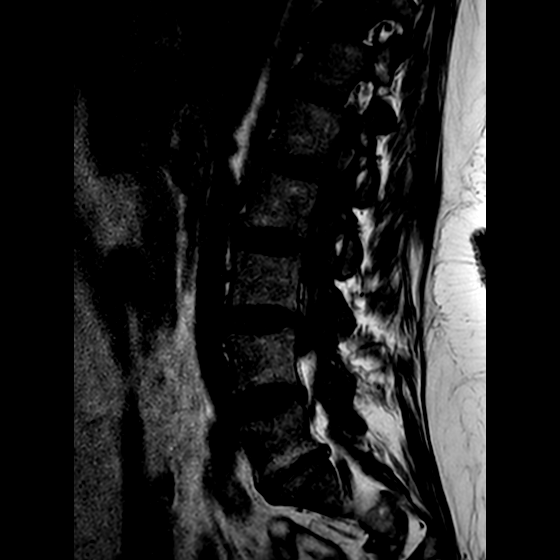
[im 9/15]
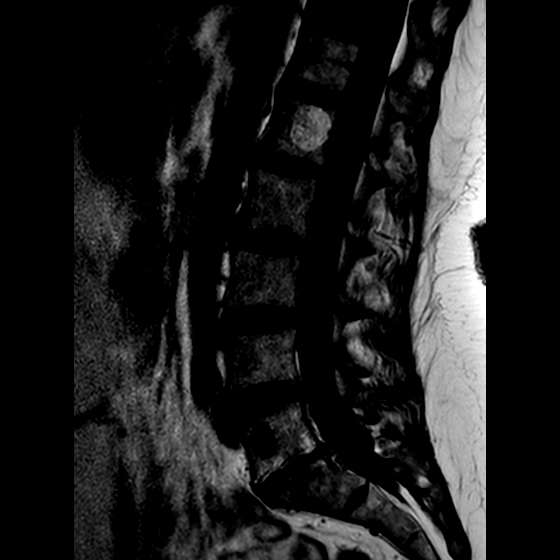
[im 12/15]
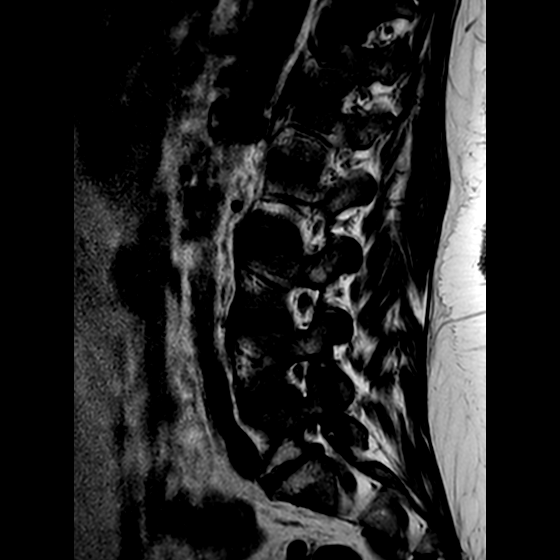
[im 15/15]
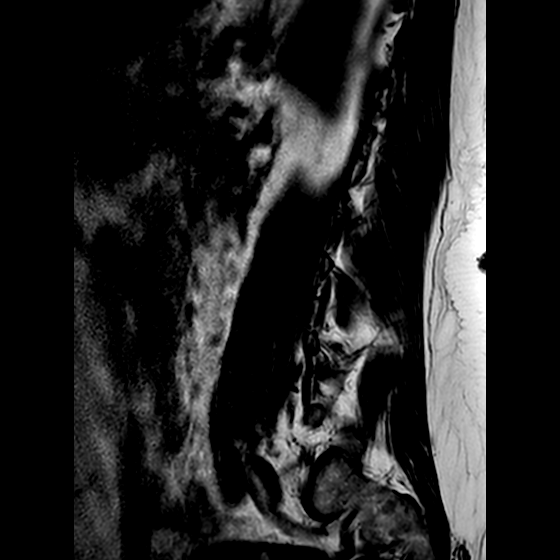

[Series 1401: T1 · axial · 4.0mm · 0.38mm/px · z∈[-457,-237]mm · 9 of 35 slices shown]
[im 1/35]
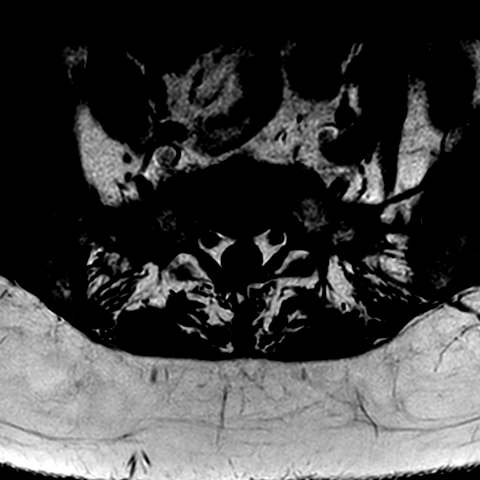
[im 6/35]
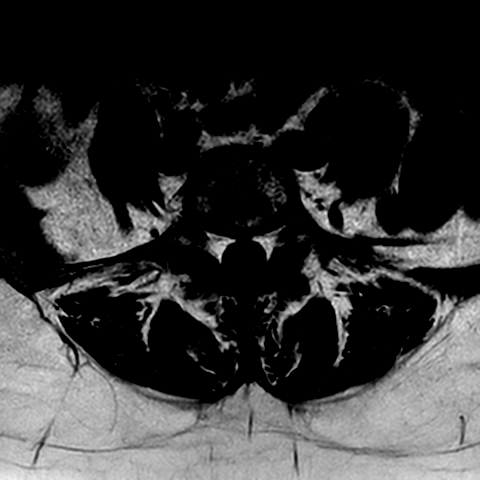
[im 12/35]
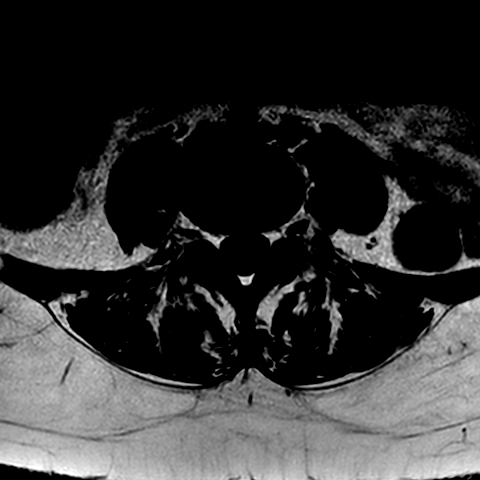
[im 15/35]
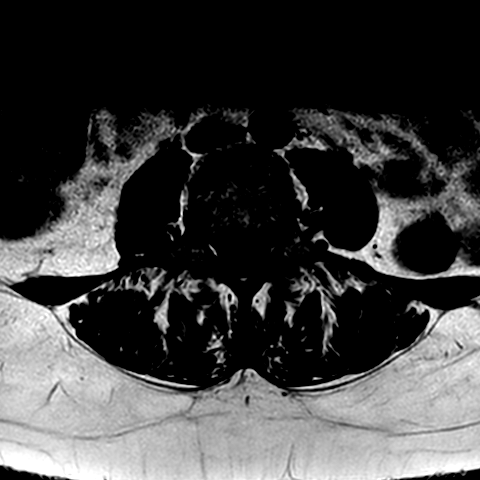
[im 18/35]
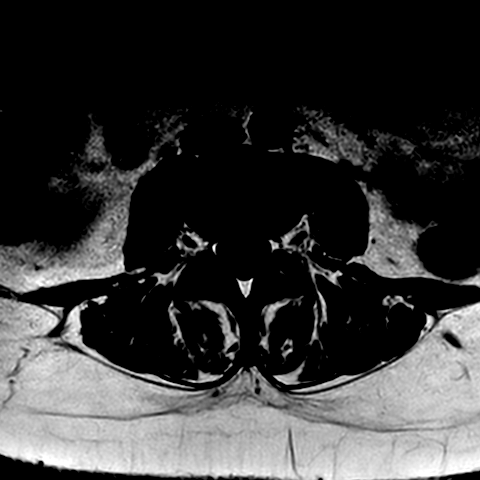
[im 20/35]
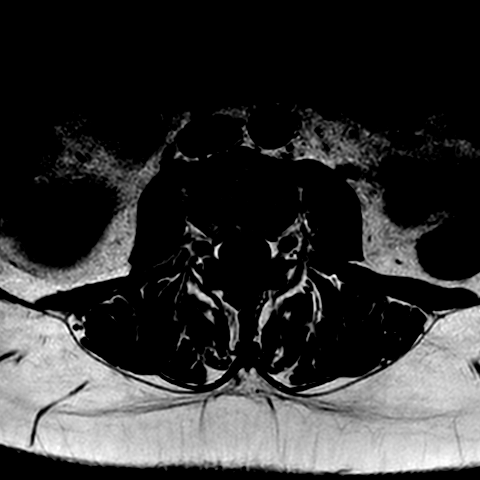
[im 23/35]
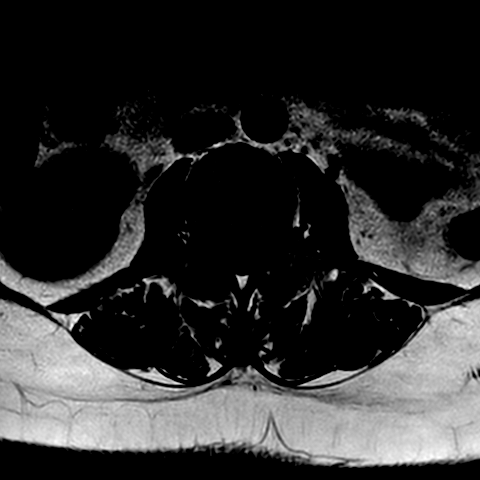
[im 29/35]
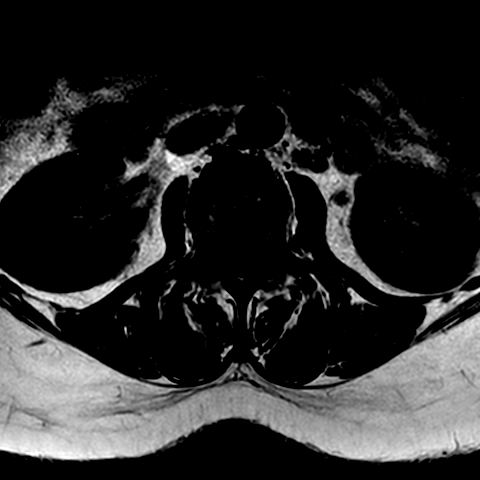
[im 35/35]
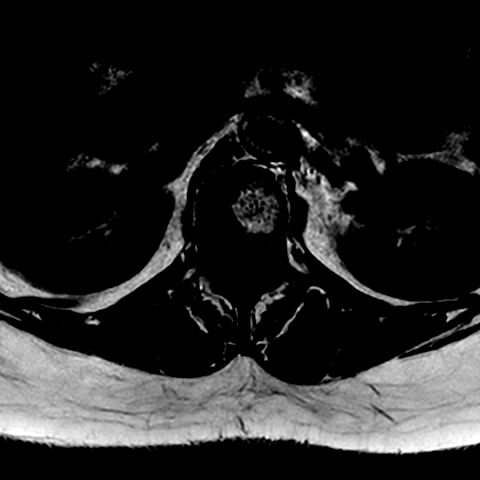

[20 of 48 positions shown; findings below may reference images not displayed]

FINDINGS: VERTEBRA: No acute fracture. Interval T11 vertebral body cement augmentation for 
moderate compression fracture, with residual marrow edema-like signal changes. 
Small amount of cement extends posterior to the T11 vertebral body, effaces the 
ventral thecal sac and abuts the cord, without central canal stenosis. 1.5 cm T9 
and 1.9 cm L1 vertebral body hemangiomas. Stable 1.6 cm signal void in the L5 
vertebral body, corresponding to densely sclerotic focus on radiographs: Finding 
is consistent with bone island. Multilevel degenerative change of the spine with 
disc space narrowing and anterior osteophytes. Mild costovertebral degenerative 
change. Type II Modic changes at L5-S1. No central canal, lateral recess or 
neural foraminal stenosis. 
SPINAL CORD: Spinal cord is normal in caliber and signal intensity. Conus 
medullaris is at the level of L1-2. 
THORACIC DISCS: Multilevel degenerative disc disease. No disc protrusions or 
evidence of discitis. 
L1-L2: The disc is normal in height and signal. No disc herniation. Normal 
facets. No spinal canal or neural foraminal stenosis. 
L2-L3: The disc is normal in height with disc desiccation. No disc herniation. 
Normal facets. No spinal canal or neural foraminal stenosis. 
L3-L4: The disc is normal in height with disc desiccation. No disc herniation. 
Normal facets. No spinal canal or neural foraminal stenosis. 
L4-L5: Stable mild broad-based disc bulge and central annular tear. The disc is 
normal in height with disc desiccation. No disc herniation. Mild facet 
arthropathy. No spinal canal or neural foraminal stenosis. 
L5-S1: Stable central disc extrusion, marked disc space narrowing anterior 
desiccation. Mild facet arthropathy and ligamentum flavum hypertrophy. No spinal 
canal or neural foraminal stenosis. 
SACRUM: No fractures, marrow edema-like signal changes or marrow replacing 
lesions. 
SI JOINTS: Mild degenerative change. 
ILIAC BONES: No fractures, marrow edema-like signal changes or marrow replacing 
lesions. 
SOFT TISSUES: The aorta is normal in diameter. No mass or fluid collection.
IMPRESSION: 1. No acute fractures. 
2. Interval T11 vertebral body cement augmentation for moderate compression 
fracture. Small amount of cement extends posterior to the T11 vertebral body, 
effaces the ventral thecal sac and abuts the cord, without central canal 
stenosis. 
3. Stable multilevel degenerative change, L4-L5 mild broad-based disc bulge and 
central annular tear, and L5-S1 central disc extrusion.

## 2020-04-02 IMAGING — CT CT ABDOMEN AND PELVIS WITHOUT CONTRAST
2 of 3 series · 15 of 46 positions shown, 17 images · non-contrast
Comparison: 11/19/2019 lumbar spine MRI

CT ABDOMEN AND PELVIS WITHOUT CONTRAST, 04/02/2020 [DATE]: 
CLINICAL INDICATION:  Nausea, bloating, tenderness. 
A search for DICOM formatted images was conducted for prior CT imaging studies 
completed at a non-affiliated media free facility.
TECHNIQUE: The abdomen and pelvis were scanned from lung bases through the 
pubic rami without contrast on a high-resolution CT scanner using dose reduction 
techniques. Routine MPR reconstructions were performed.

[Series 2: abd/pel ax wo · axial · 0.78mm/px · z∈[-432,+9]mm · 12 of 169 slices shown, 14 images]
[im 11/169  soft-tissue]
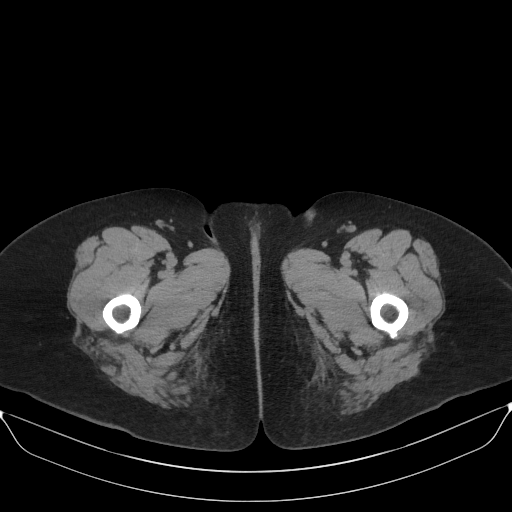
[im 11/169  bone]
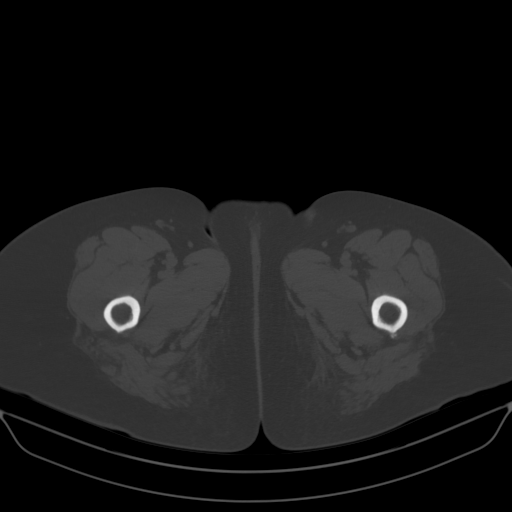
[im 22/169  soft-tissue]
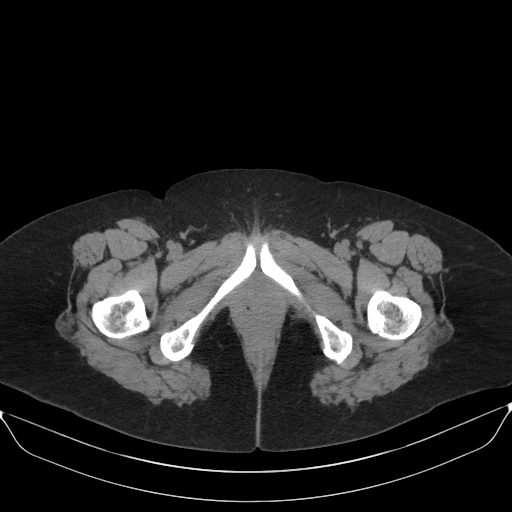
[im 38/169  soft-tissue]
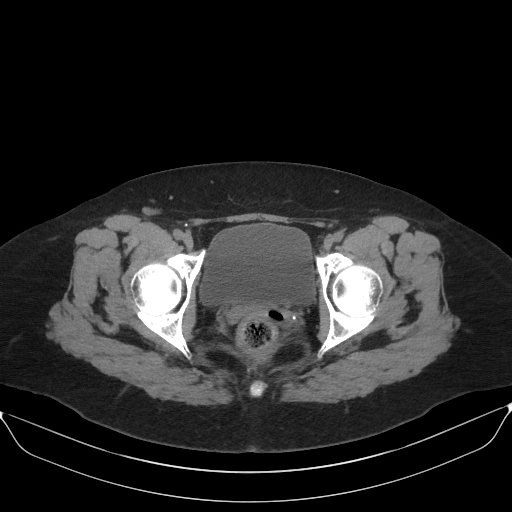
[im 49/169  soft-tissue]
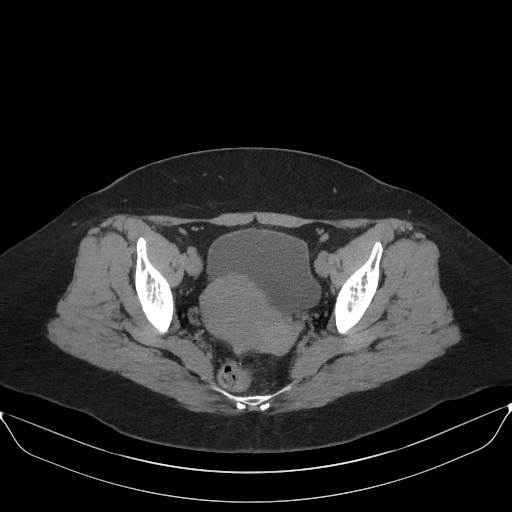
[im 66/169  soft-tissue]
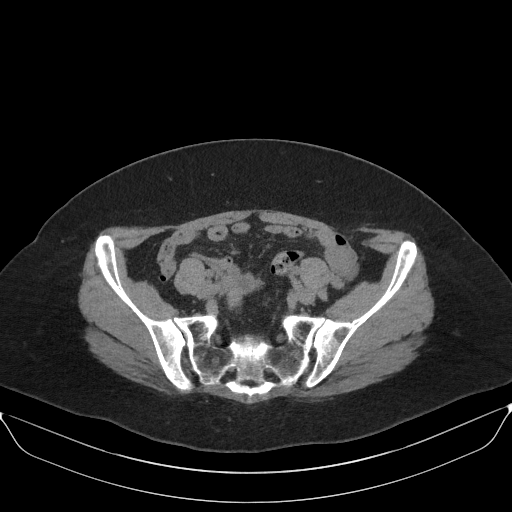
[im 76/169  soft-tissue]
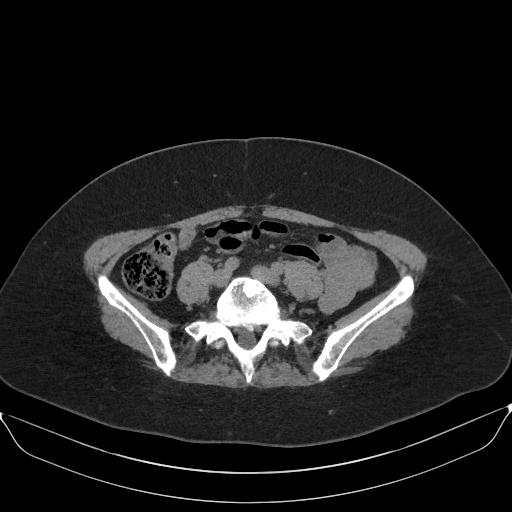
[im 93/169  soft-tissue]
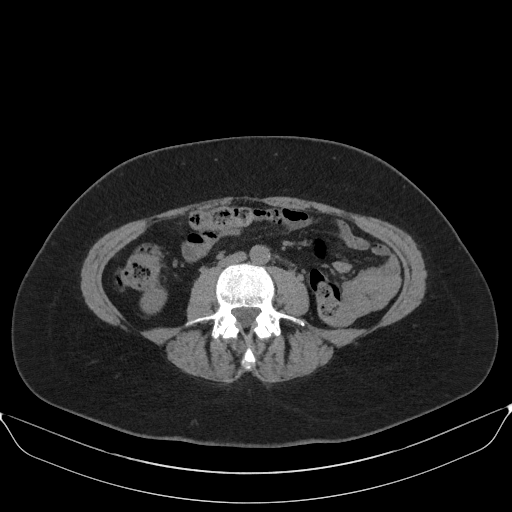
[im 103/169  soft-tissue]
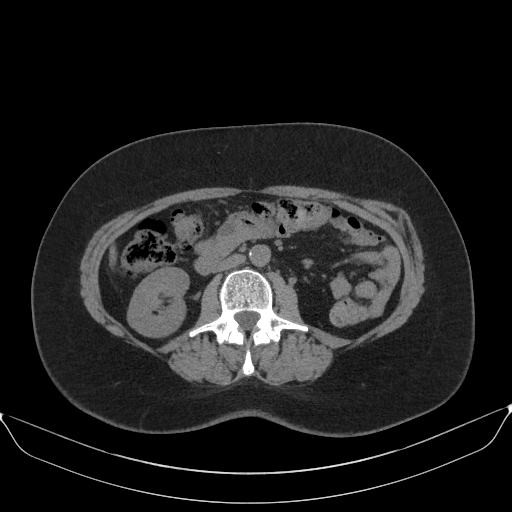
[im 120/169  soft-tissue]
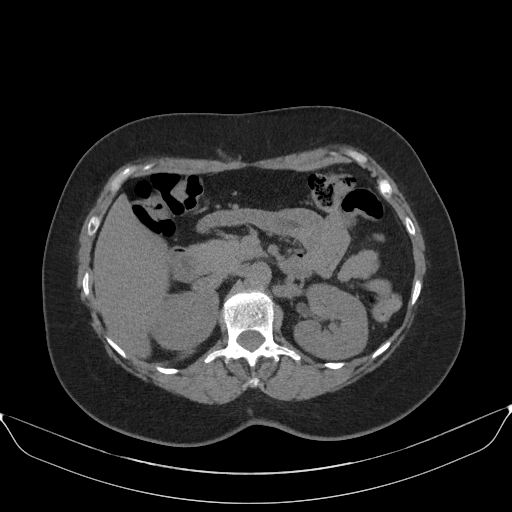
[im 120/169  bone]
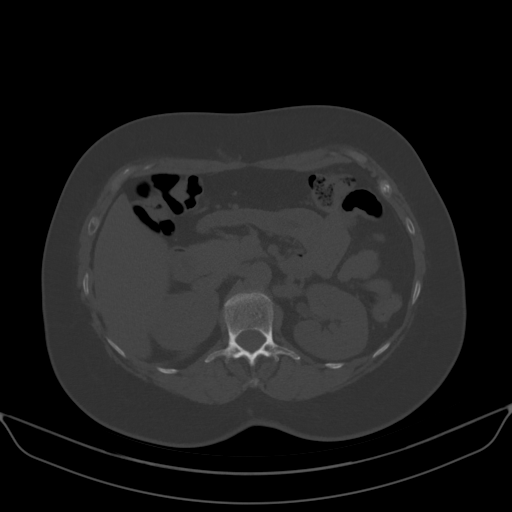
[im 131/169  soft-tissue]
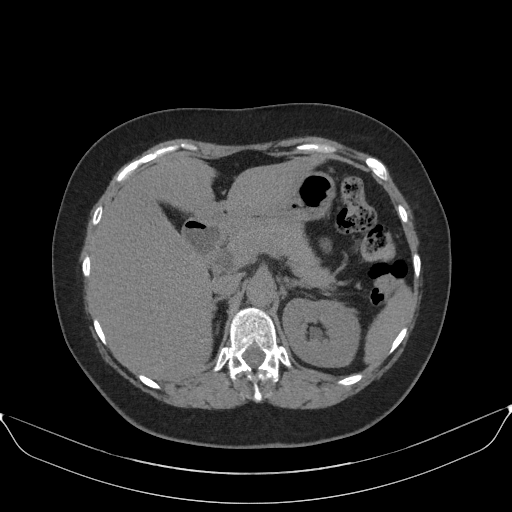
[im 147/169  soft-tissue]
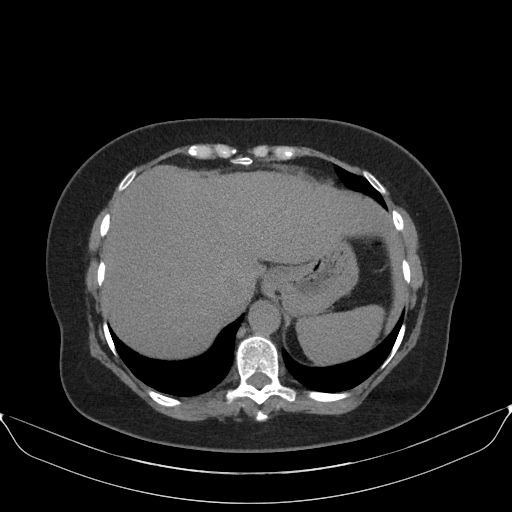
[im 158/169  soft-tissue]
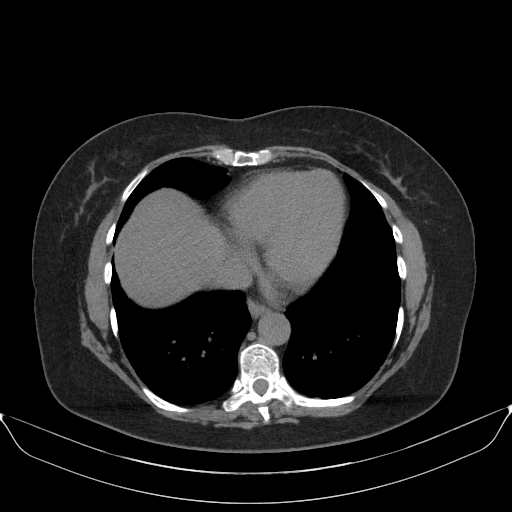

[Series 3: abd/pel cor wo · coronal · 0.78mm/px · 3 of 163 slices shown]
[im 55/163  soft-tissue]
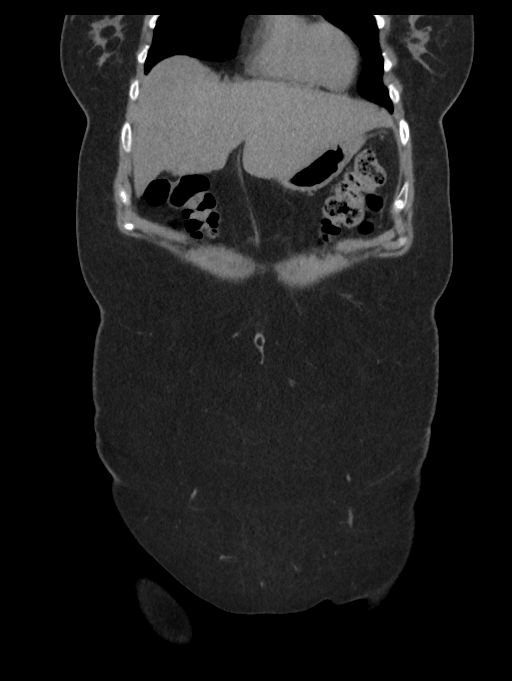
[im 73/163  soft-tissue]
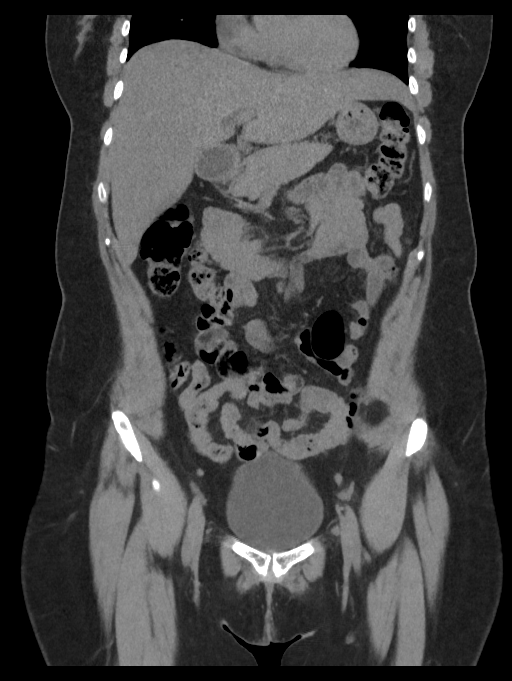
[im 91/163  soft-tissue]
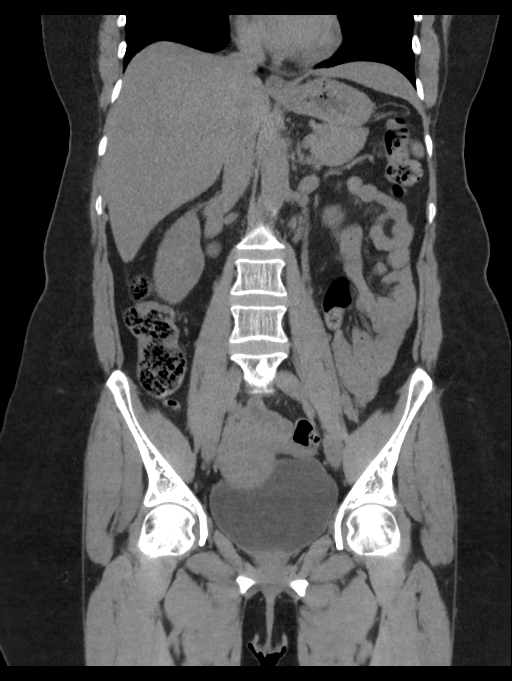

[15 of 46 positions shown; findings below may reference images not displayed]

FINDINGS: LUNG BASES: Several 1 mm peripheral basilar noncalcified nodules. No pleural 
effusions. 
HEPATOBILIARY: Non contrast images show no mass or biliary dilatation. 
Cholecystectomy. 
SPLEEN: Normal in size. 
PANCREAS: No evidence for ductal dilatation or mass. 
ADRENALS: No mass. 
GENITOURINARY: No hydronephrosis or kidney stones. Bladder is unremarkable. 
Enlarged, fibroid uterus: Pelvic sonography may be helpful for further 
evaluation, if clinically indicated. 
LYMPH NODES: No adenopathy. 
STOMACH, SMALL BOWEL AND COLON: Sigmoid resection staples. No bowel wall 
thickening or obstruction. Normal appendix. 
VASCULAR STRUCTURES: No aneurysm. Atherosclerosis. 
MUSCULOSKELETAL: T11 vertebral body cement augmentation with cement extension 
posterior to the vertebral body, resulting in mild central canal stenosis. L1 
vertebral body hemangioma and 1.6 cm L5 vertebral body bone island. Degenerative 
change, most marked at L5-S1, and L5-S1 disc extrusion and disc bulge. 
Osteopenia.
IMPRESSION: 1.  Sigmoid resection staples. 
2.  Enlarged, fibroid uterus: Pelvic sonography may be helpful for further 
evaluation, if clinically indicated. 
3.  Cholecystectomy. 
4.  Several 1 mm peripheral basilar noncalcified nodules. 
5.  T11 vertebral body cement augmentation/mild central canal stenosis, 
degenerative change, L5-S1 disc extrusion/ bulge and osteopenia. 
REFERENCE: Recommendations for pulmonary nodule follow-up according to 
[HOSPITAL] Guidelines. 
Nodule size: < 6 mm (averaged measurements) 
*Low-risk patients: No followup needed. 
*High-risk patients: Optional CT at 12 months. 
NOTE: 
Low risk patients: minimal or absent history of smoking and or other known risk 
factors. 
High risk patients: history of smoking or of other known risk factors (e.g. 
first degree relative with lung cancer, or exposure to asbestos, radon uranium) 
RADIATION DOSE REDUCTION: All CT scans are performed using radiation dose 
reduction techniques, when applicable.  Technical factors are evaluated and 
adjusted to ensure appropriate moderation of exposure.  Automated dose 
management technology is applied to adjust the radiation doses to minimize 
exposure while achieving diagnostic quality images.

## 2020-07-07 IMAGING — CT CT CHEST WITHOUT CONTRAST
2 of 4 series · 15 of 36 positions shown, 18 images · non-contrast
Comparison: CT abdomen dated 04/02/2020

CT CHEST WITHOUT CONTRAST, 07/07/2020 [DATE]: 
CLINICAL INDICATION:  Follow-up pulmonary nodule 
A search for DICOM formatted images was conducted for prior CT imaging studies 
completed at a non-affiliated media free facility.
TECHNIQUE: The chest was scanned from base of neck through the lung bases 
without contrast on a high resolution low dose CT scanner. Routine MPR and MIP 
3D renderings were reconstructed on an independent workstation with concurrent 
physician supervision.

[Series 3: lung · axial · 0.80mm/px · z∈[-240,+36]mm · 12 of 154 slices shown, 15 images]
[im 8/154  mediastinal]
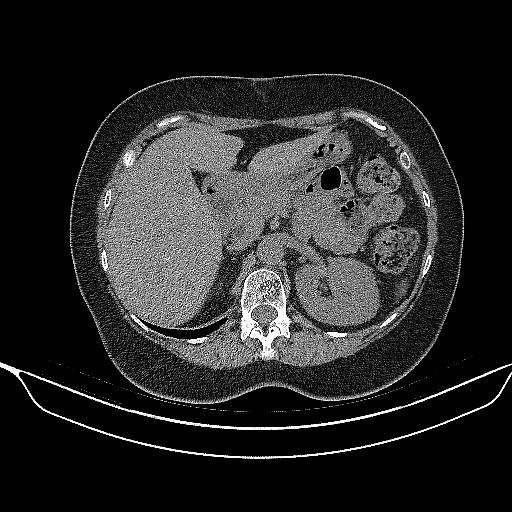
[im 8/154  lung]
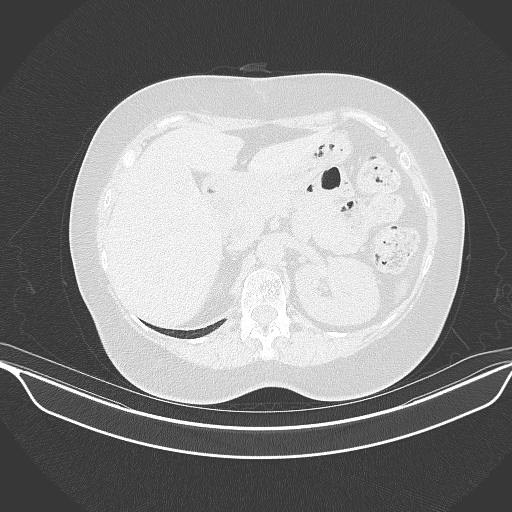
[im 22/154  lung]
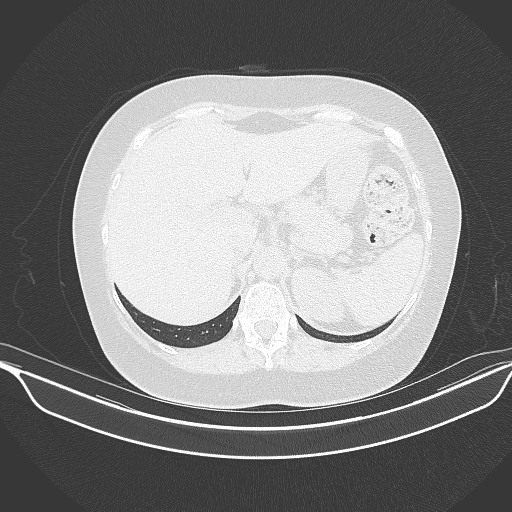
[im 37/154  lung]
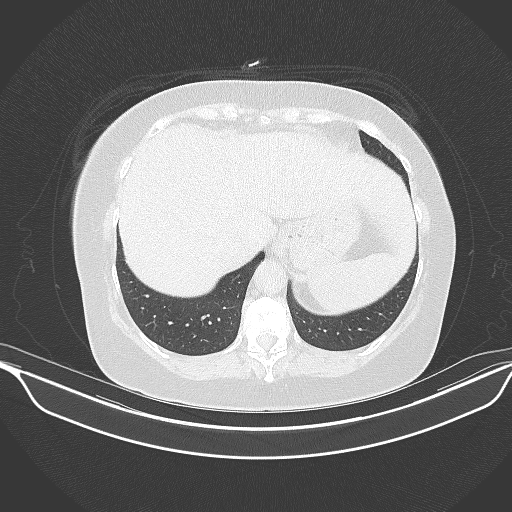
[im 44/154  lung]
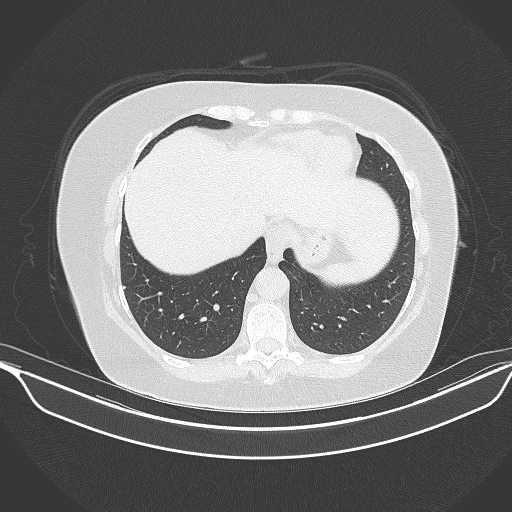
[im 59/154  mediastinal]
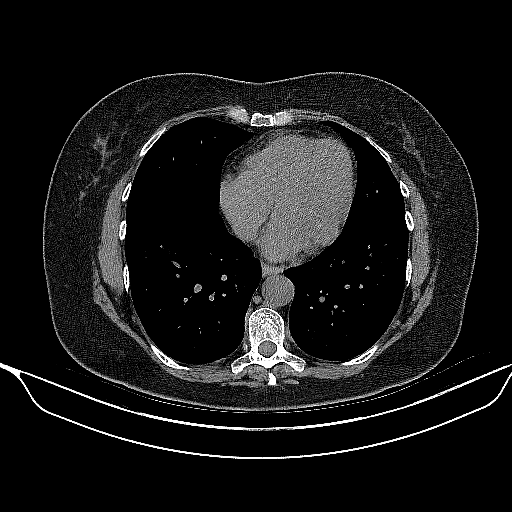
[im 59/154  lung]
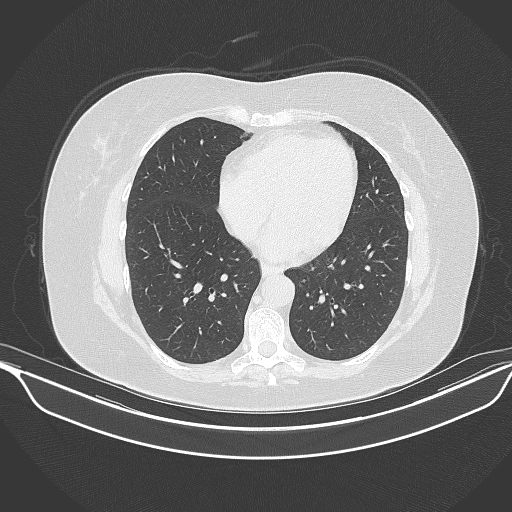
[im 73/154  lung]
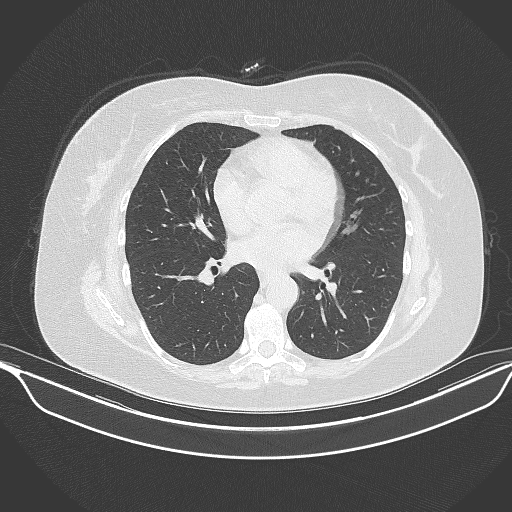
[im 81/154  lung]
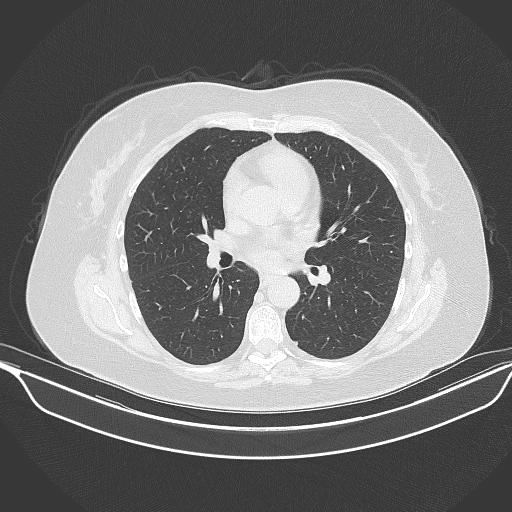
[im 95/154  lung]
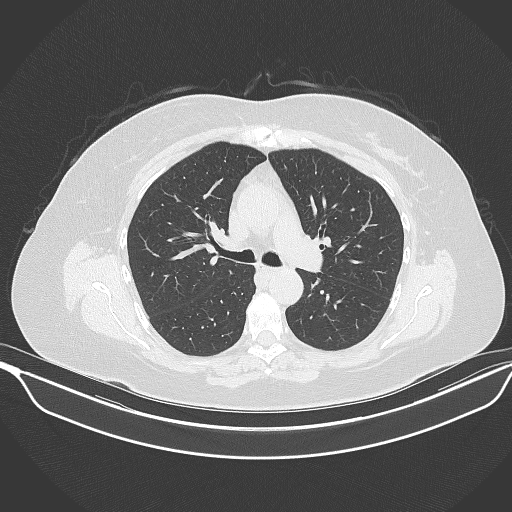
[im 110/154  mediastinal]
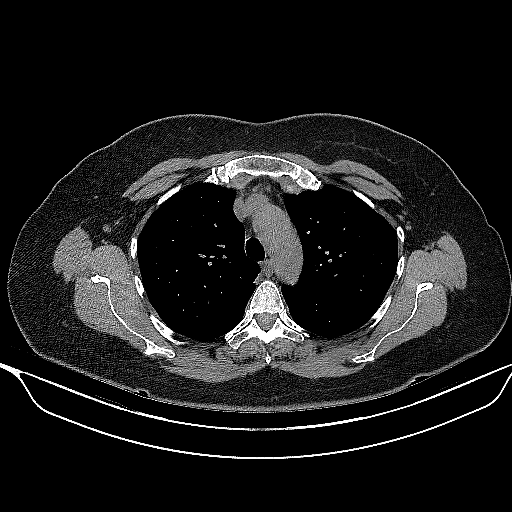
[im 110/154  lung]
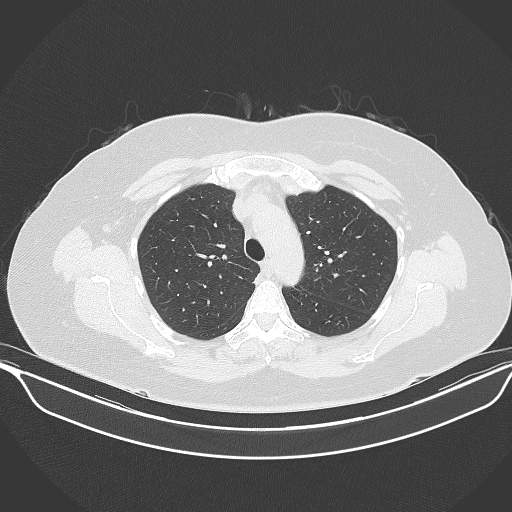
[im 117/154  lung]
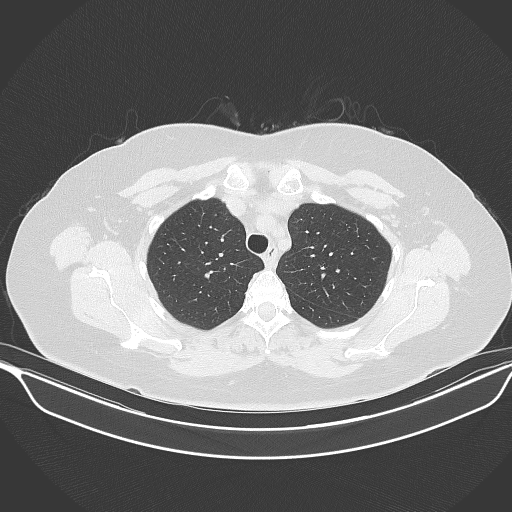
[im 132/154  lung]
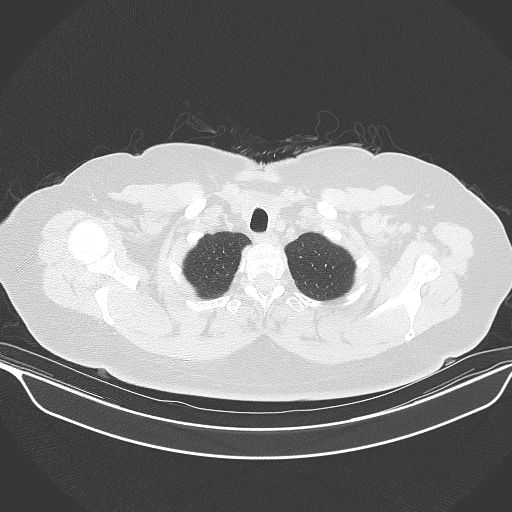
[im 146/154  lung]
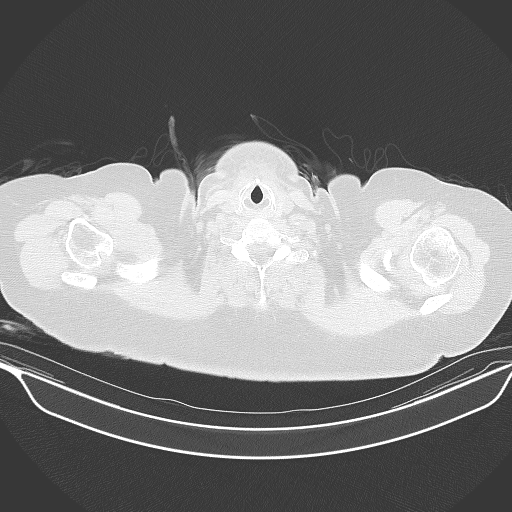

[Series 5: coronal · coronal · 0.63mm/px · 3 of 133 slices shown]
[im 27/133  lung]
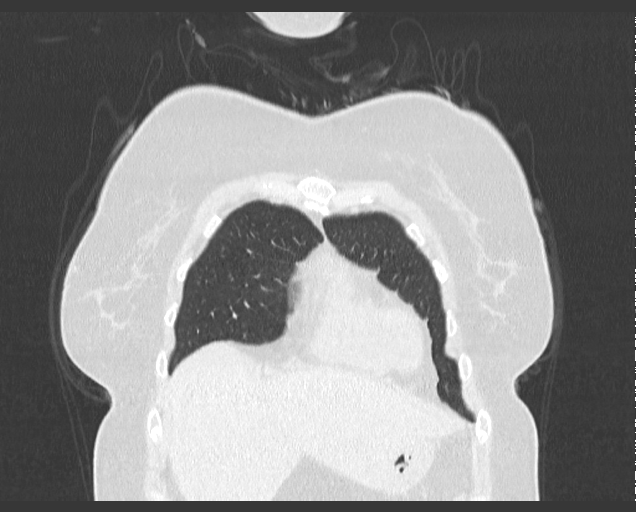
[im 53/133  lung]
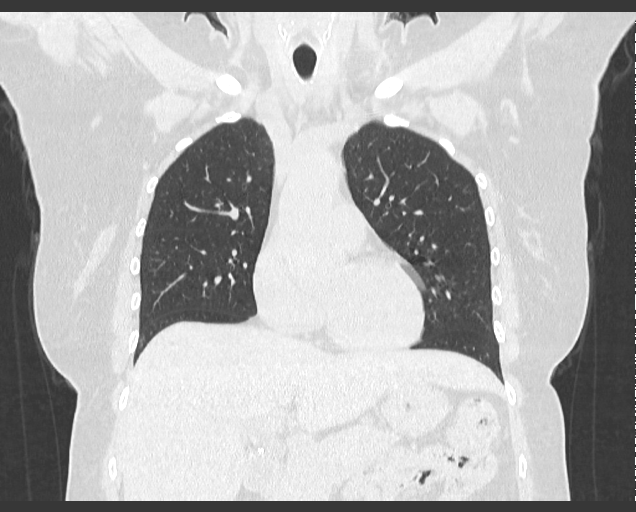
[im 80/133  lung]
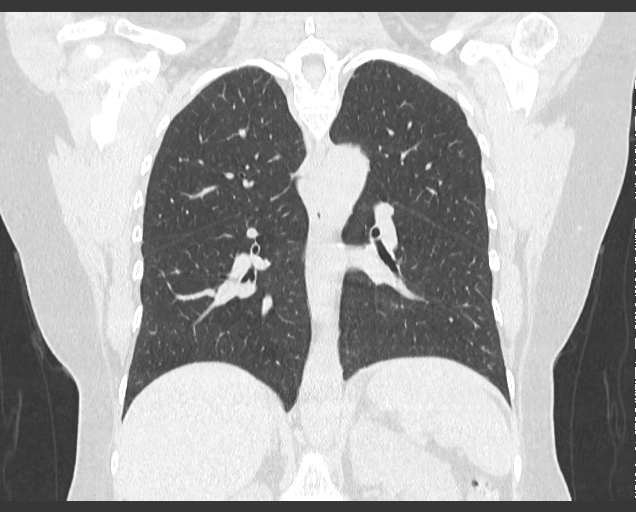

[15 of 36 positions shown; findings below may reference images not displayed]

FINDINGS: LUNGS AND PLEURA:  There is evidence of small subpleural nodules in the lung 
bilaterally, some of which are calcified. These are likely granulomata. No other 
abnormalities in the lung are apparent. No abnormalities in the pleura are 
observed. 
MEDIASTINUM:  No abnormalities in the appearance the mediastinal structures are 
observed. No adenopathy. Normal heart size. No pericardial effusion. 
CHEST WALL/AXILLA: No abnormalities in the chest wall or axilla are observed. 
UPPER ABDOMEN: There has been a previous cholecystectomy. No evidence of an 
active pathologic process in the upper abdomen is apparent. 
MUSCULOSKELETAL: There is evidence of a previous compression fracture at T11. 
The fracture appears to have undergone percutaneous stabilization. Some of the 
methyl methacrylate appears to enter the spinal canal. No changes in the 
appearance of the musculoskeletal system are apparent.
IMPRESSION: There are multiple small nodules in the lung, some of which are calcified. The 
nodules are, therefore, likely secondary to a previous granulomatous infection. 
No evidence of an active cardiopulmonary pathologic process is observed. 
RADIATION DOSE REDUCTION: All CT scans are performed using radiation dose 
reduction techniques, when applicable.  Technical factors are evaluated and 
adjusted to ensure appropriate moderation of exposure.  Automated dose 
management technology is applied to adjust the radiation doses to minimize 
exposure while achieving diagnostic quality images.

## 2021-01-21 IMAGING — MG MAMMOGRAPHY SCREENING BILATERAL 3[PERSON_NAME]
8 series · 8 of 24 positions shown · non-contrast
Comparison: Comparison was made to prior examinations.

________________________________________________________________________________________________ 
MAMMOGRAPHY SCREENING BILATERAL 3TRIPTI TIGER, 01/21/2021 [DATE]: 
CLINICAL INDICATION: Screening.
TECHNIQUE: Digital bilateral mammograms and 3-D Tomosynthesis were obtained. 
These were interpreted both primarily and with the aid of computer-aided 
detection system.  
BREAST DENSITY: (Level B) There are scattered areas of fibroglandular density.

[L MLO]
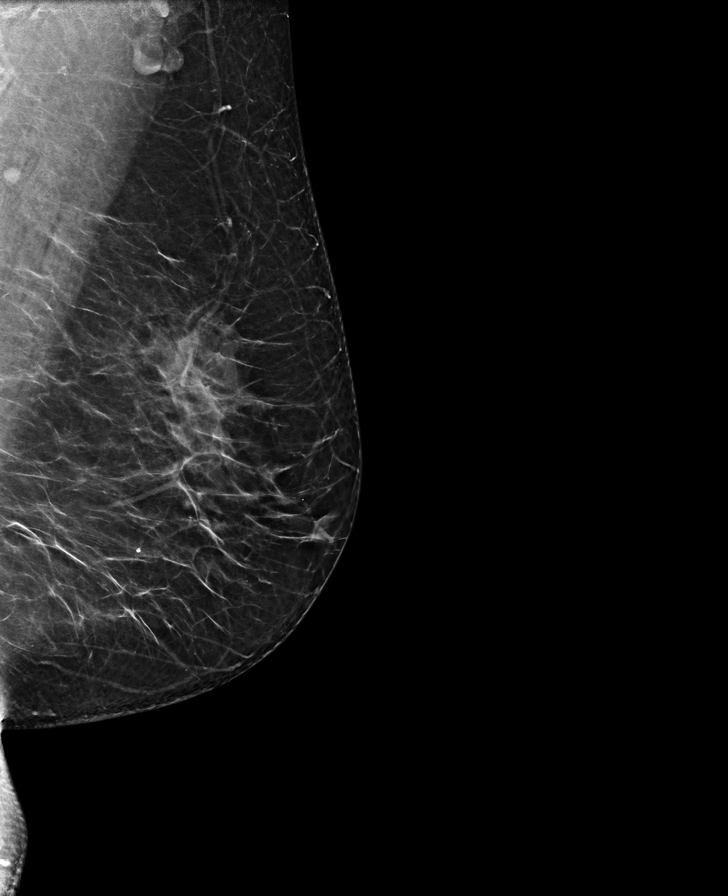

[R CC]
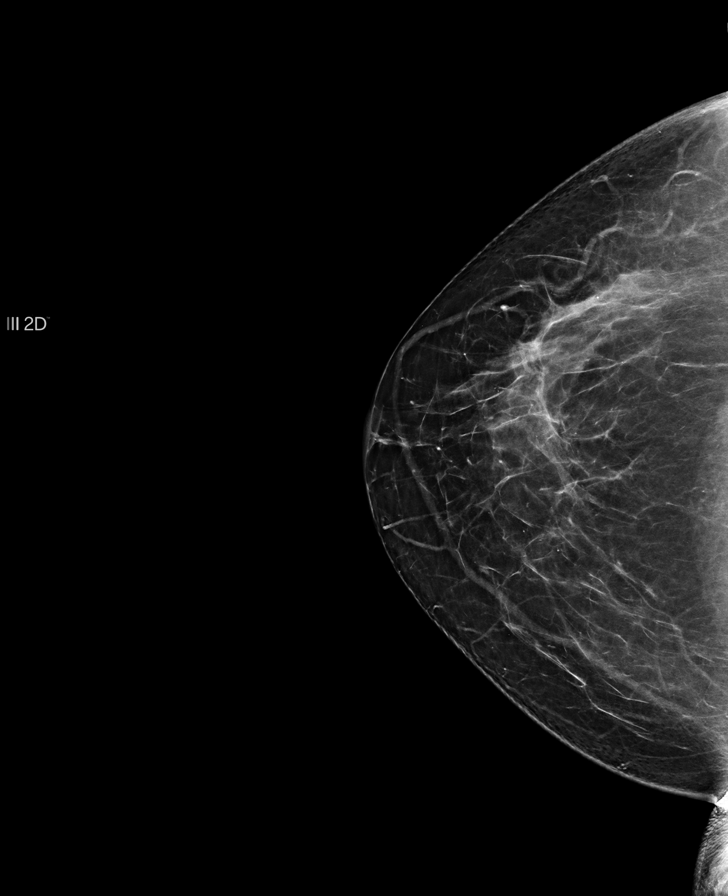

[R MLO]
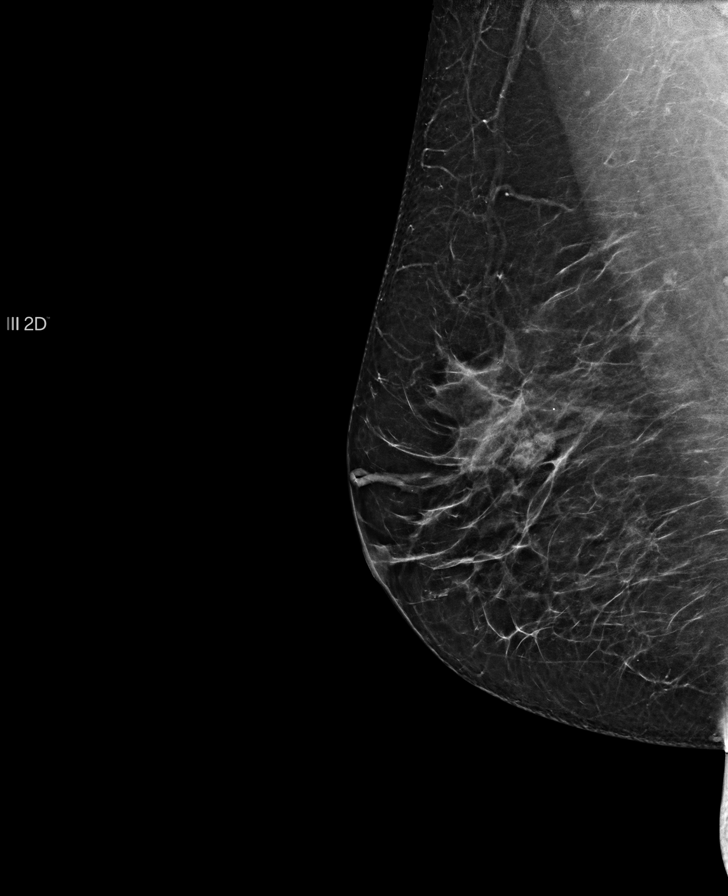

[L CC]
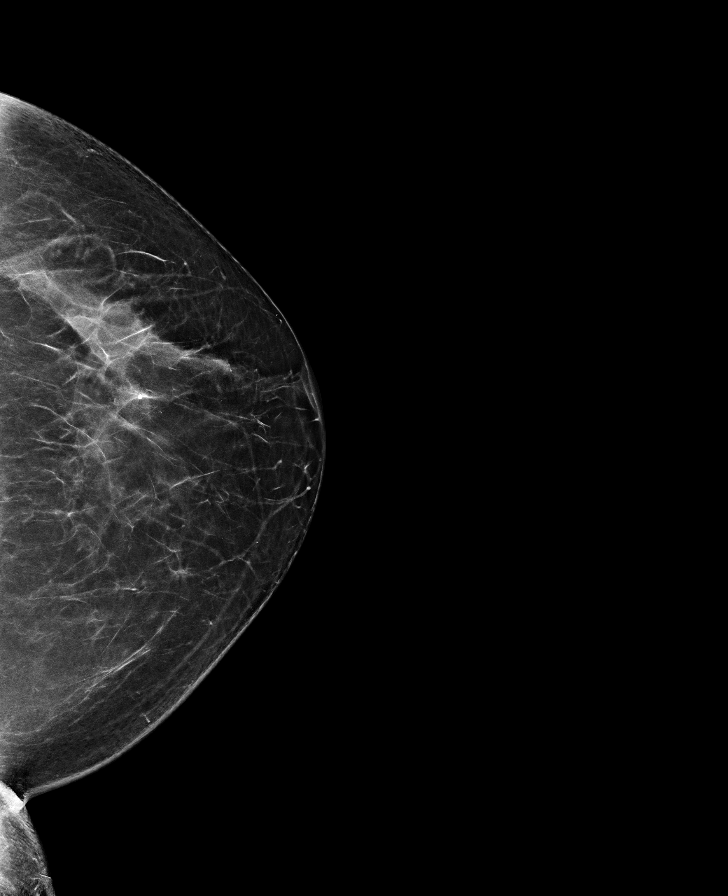

[R CC tomo · tomo slice 38/75.0]
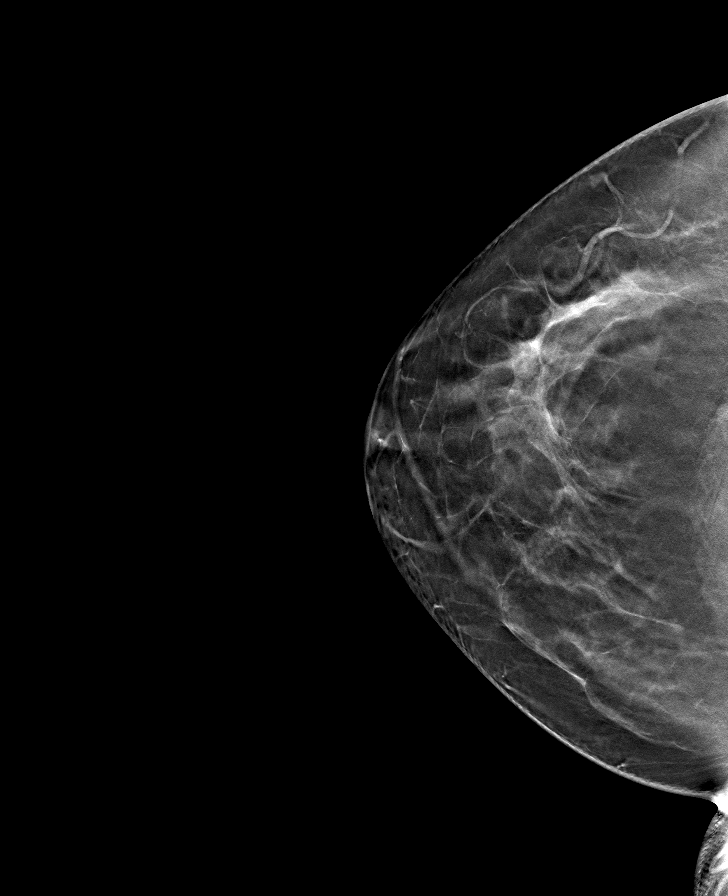

[L MLO tomo · tomo slice 37/74.0]
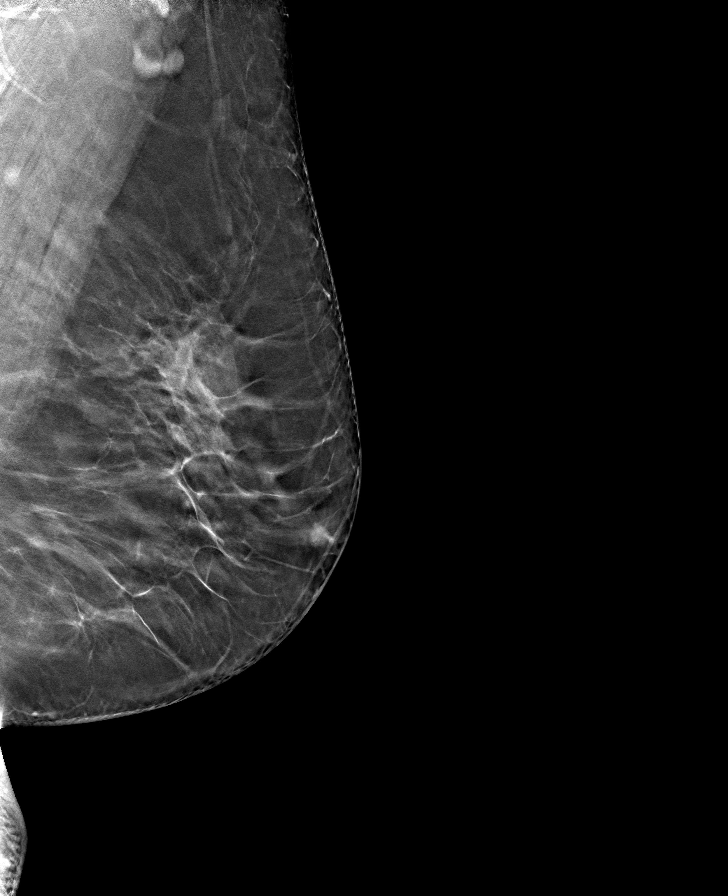

[R MLO tomo · tomo slice 35/70.0]
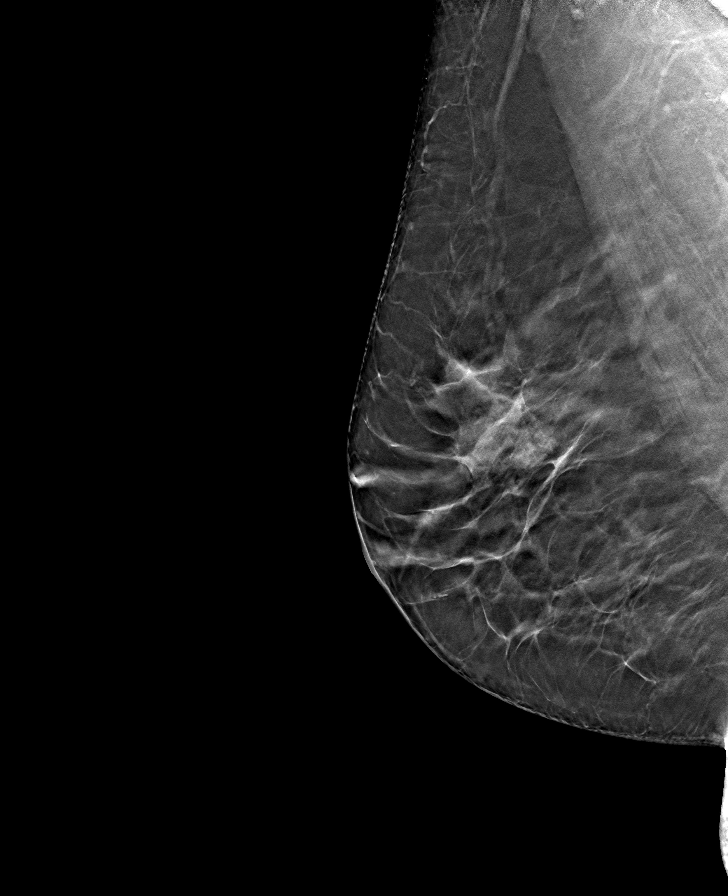

[L CC tomo · tomo slice 37/73.0]
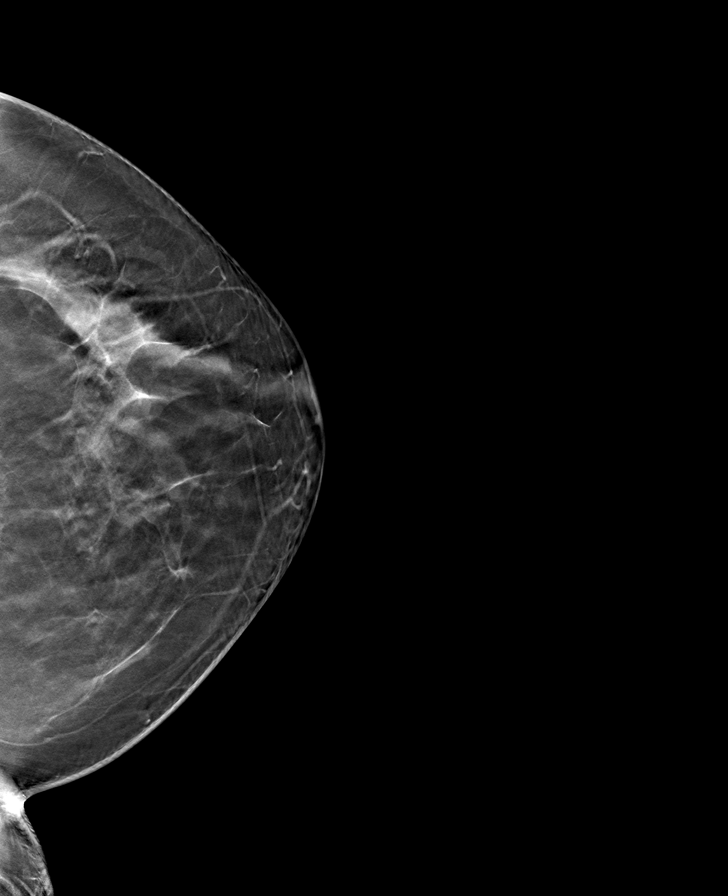

[8 of 24 positions shown; findings below may reference images not displayed]

FINDINGS: No suspicious mass, calcifications, or area of architectural 
distortion in either breast.
IMPRESSION: Stable exam. 
(BI-RADS 2) Benign findings. Routine mammographic follow-up is recommended.

## 2021-04-28 IMAGING — DX KNEE 3 VIEWS LEFT
1 series · 3 of 3 positions shown · non-contrast
Comparison: None prior.

________________________________________________________________________________________________ 
KNEE 3 VIEWS LEFT, KNEE 3 VIEWS RIGHT, 04/28/2021 [DATE]: 
CLINICAL INDICATION: Pain in the right knee for one year. Symptoms have 
progressively worsened.

[Series 1: lateral · U · 0.14mm/px · 3 of 3 slices shown]
[im 1/3]
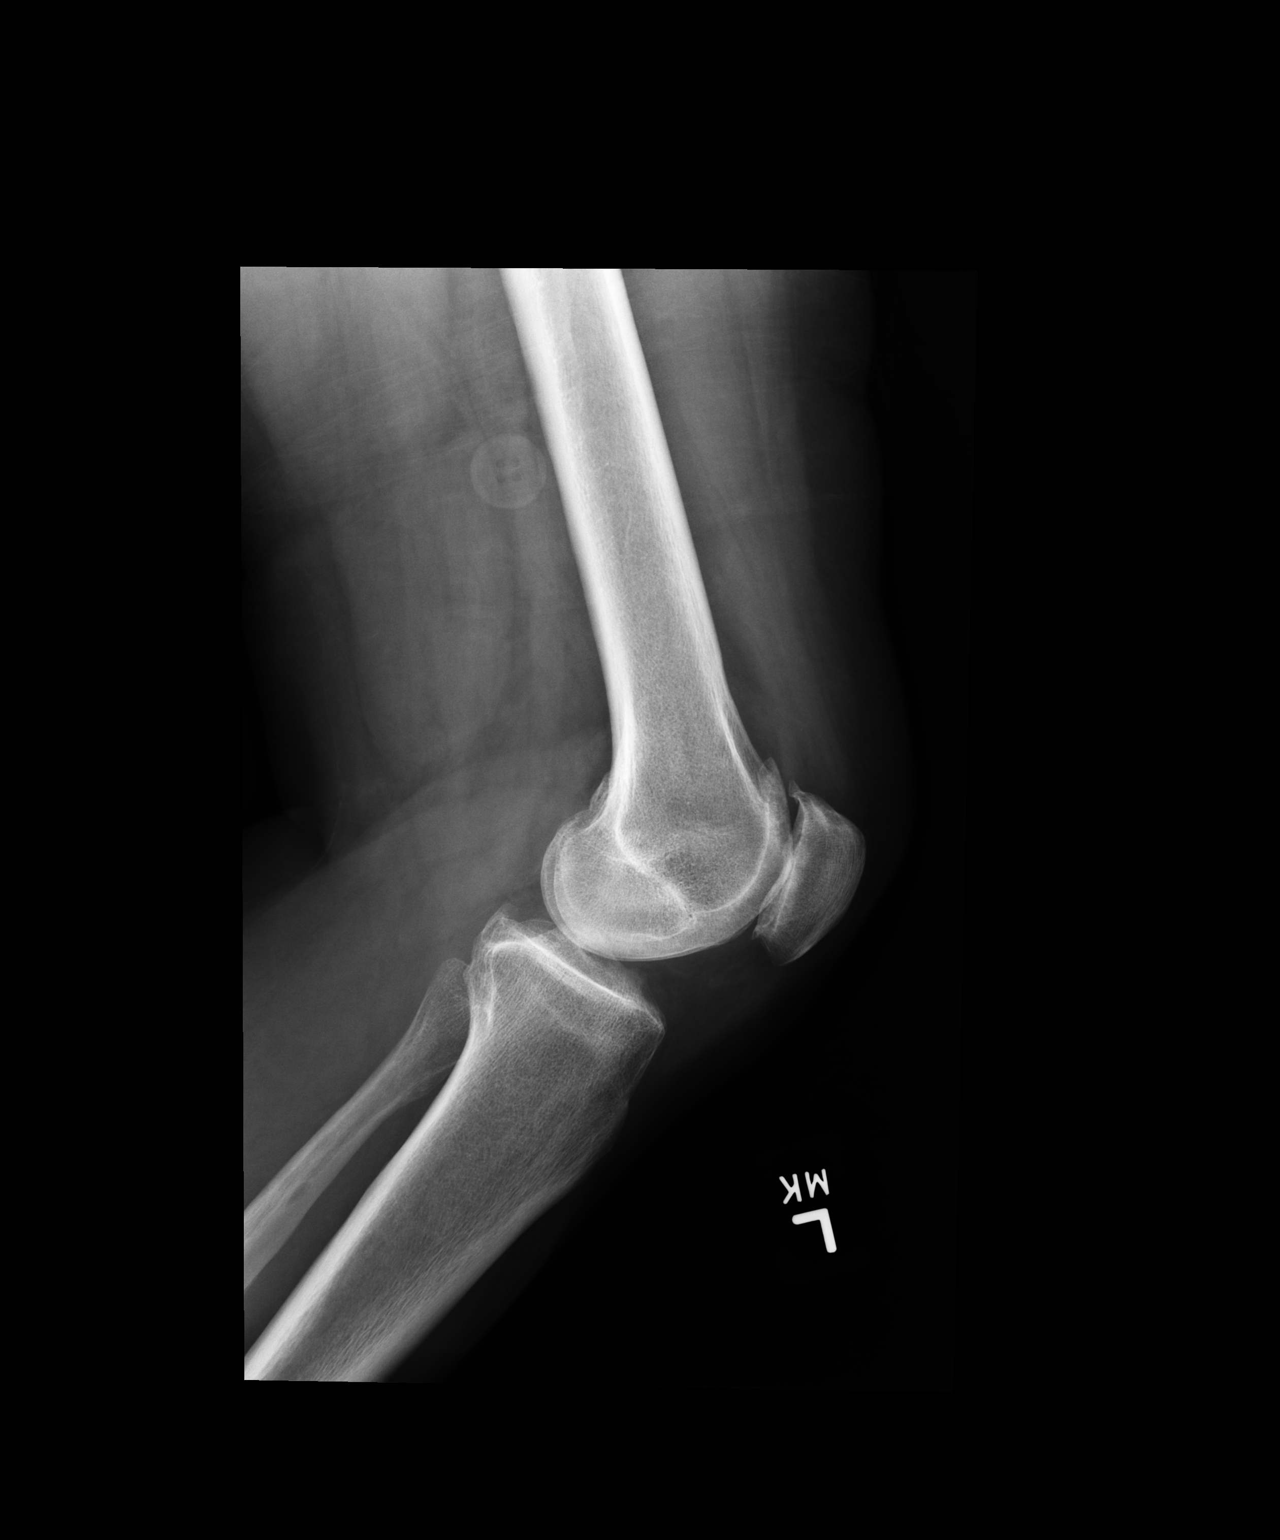
[im 2/3]
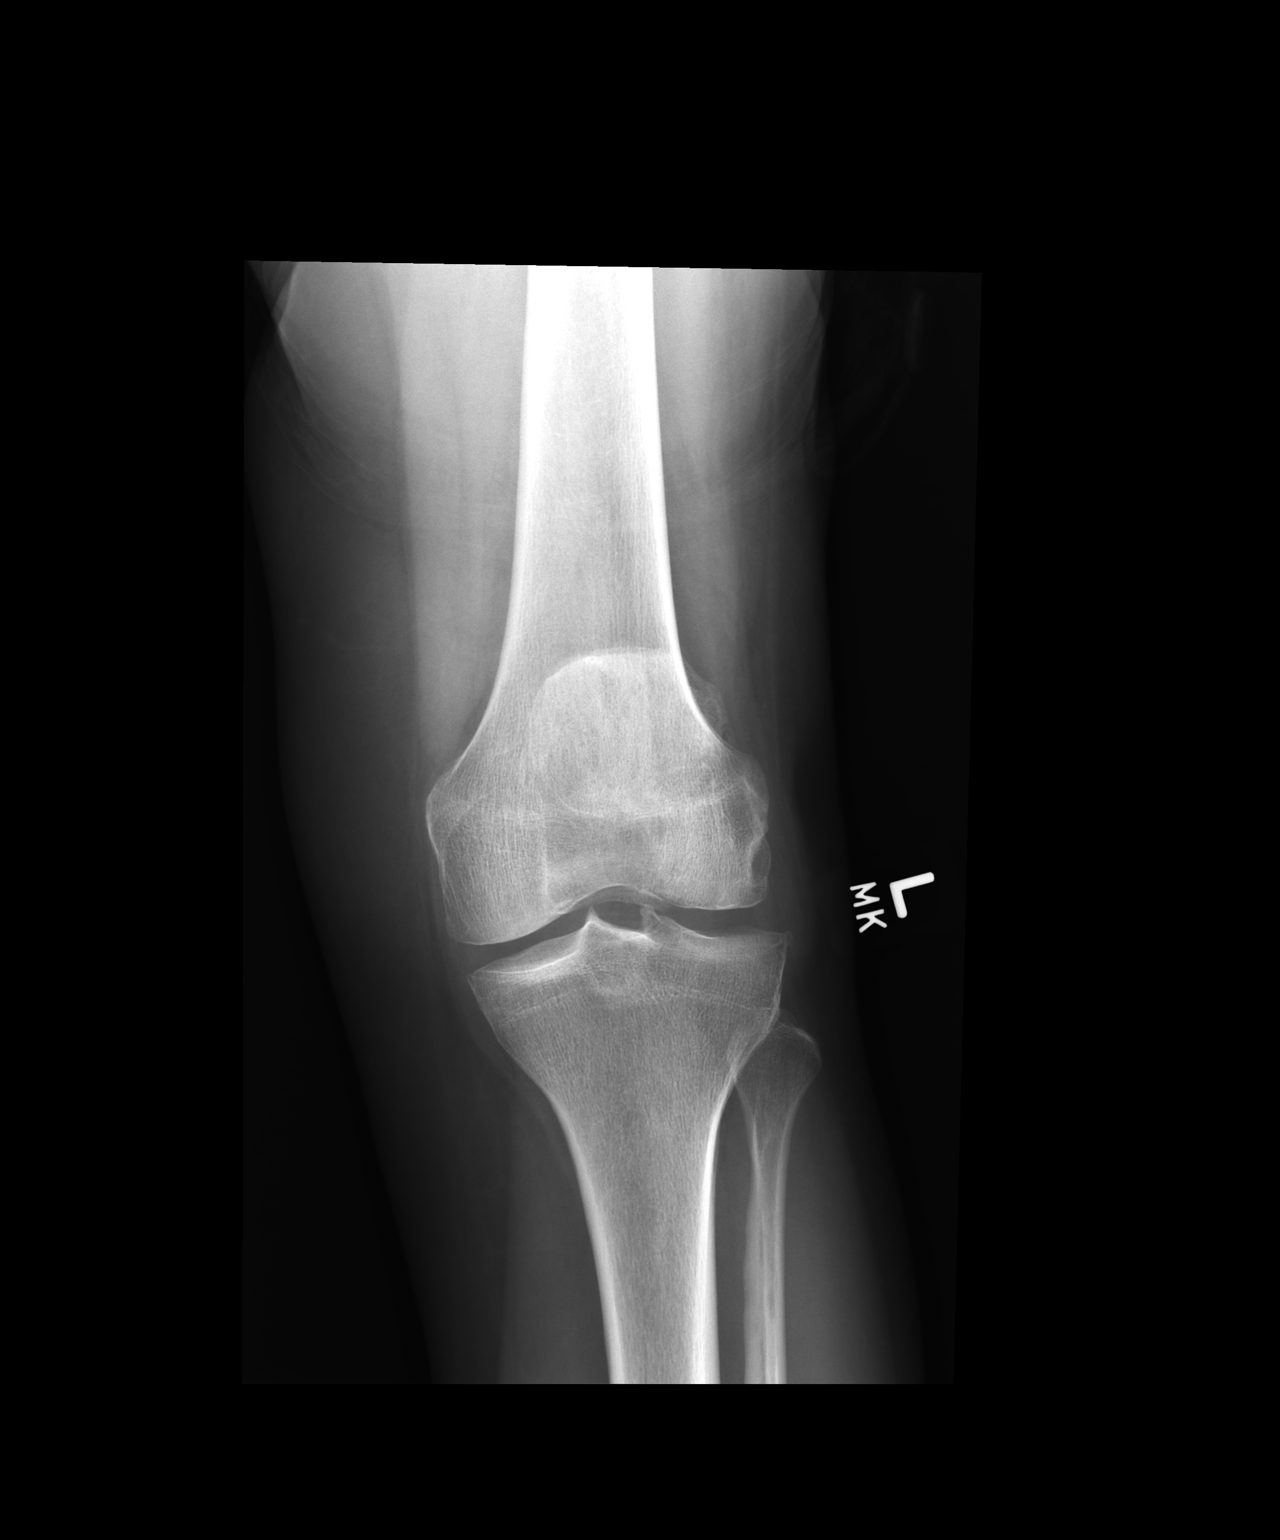
[im 3/3]
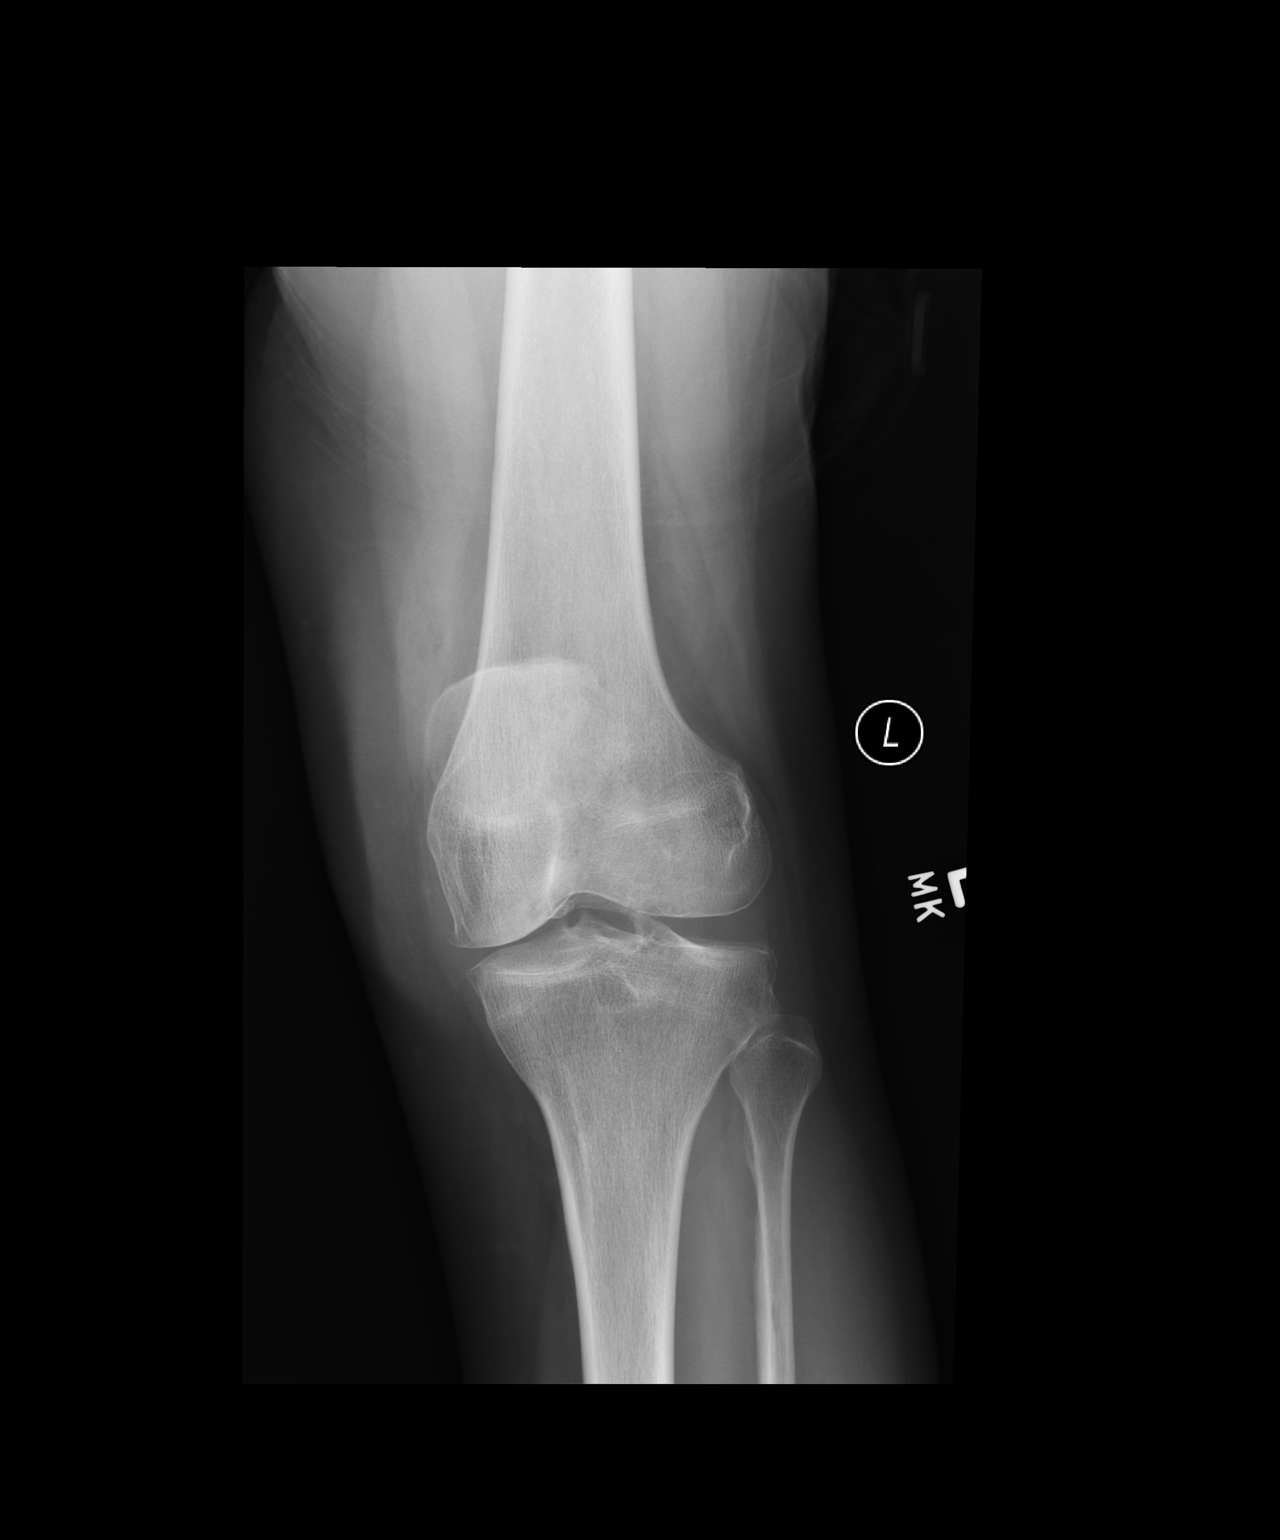

[3 of 3 positions shown; findings below may reference images not displayed]

FINDINGS: Advanced joint space narrowing of the bilateral patellofemoral 
compartments. There is osteophytic spurring greater on the left. Normal bone 
mineralization. No fracture. No effusion. No focal soft tissue swelling.
IMPRESSION: Advanced degenerative changes both knees involving the patellofemoral 
compartments.

## 2021-04-28 IMAGING — DX KNEE 3 VIEWS RIGHT
1 series · 3 of 3 positions shown · non-contrast
Comparison: None prior.

________________________________________________________________________________________________ 
KNEE 3 VIEWS LEFT, KNEE 3 VIEWS RIGHT, 04/28/2021 [DATE]: 
CLINICAL INDICATION: Pain in the right knee for one year. Symptoms have 
progressively worsened.

[Series 1: ap int rot · U · 0.14mm/px · 3 of 3 slices shown]
[im 1/3]
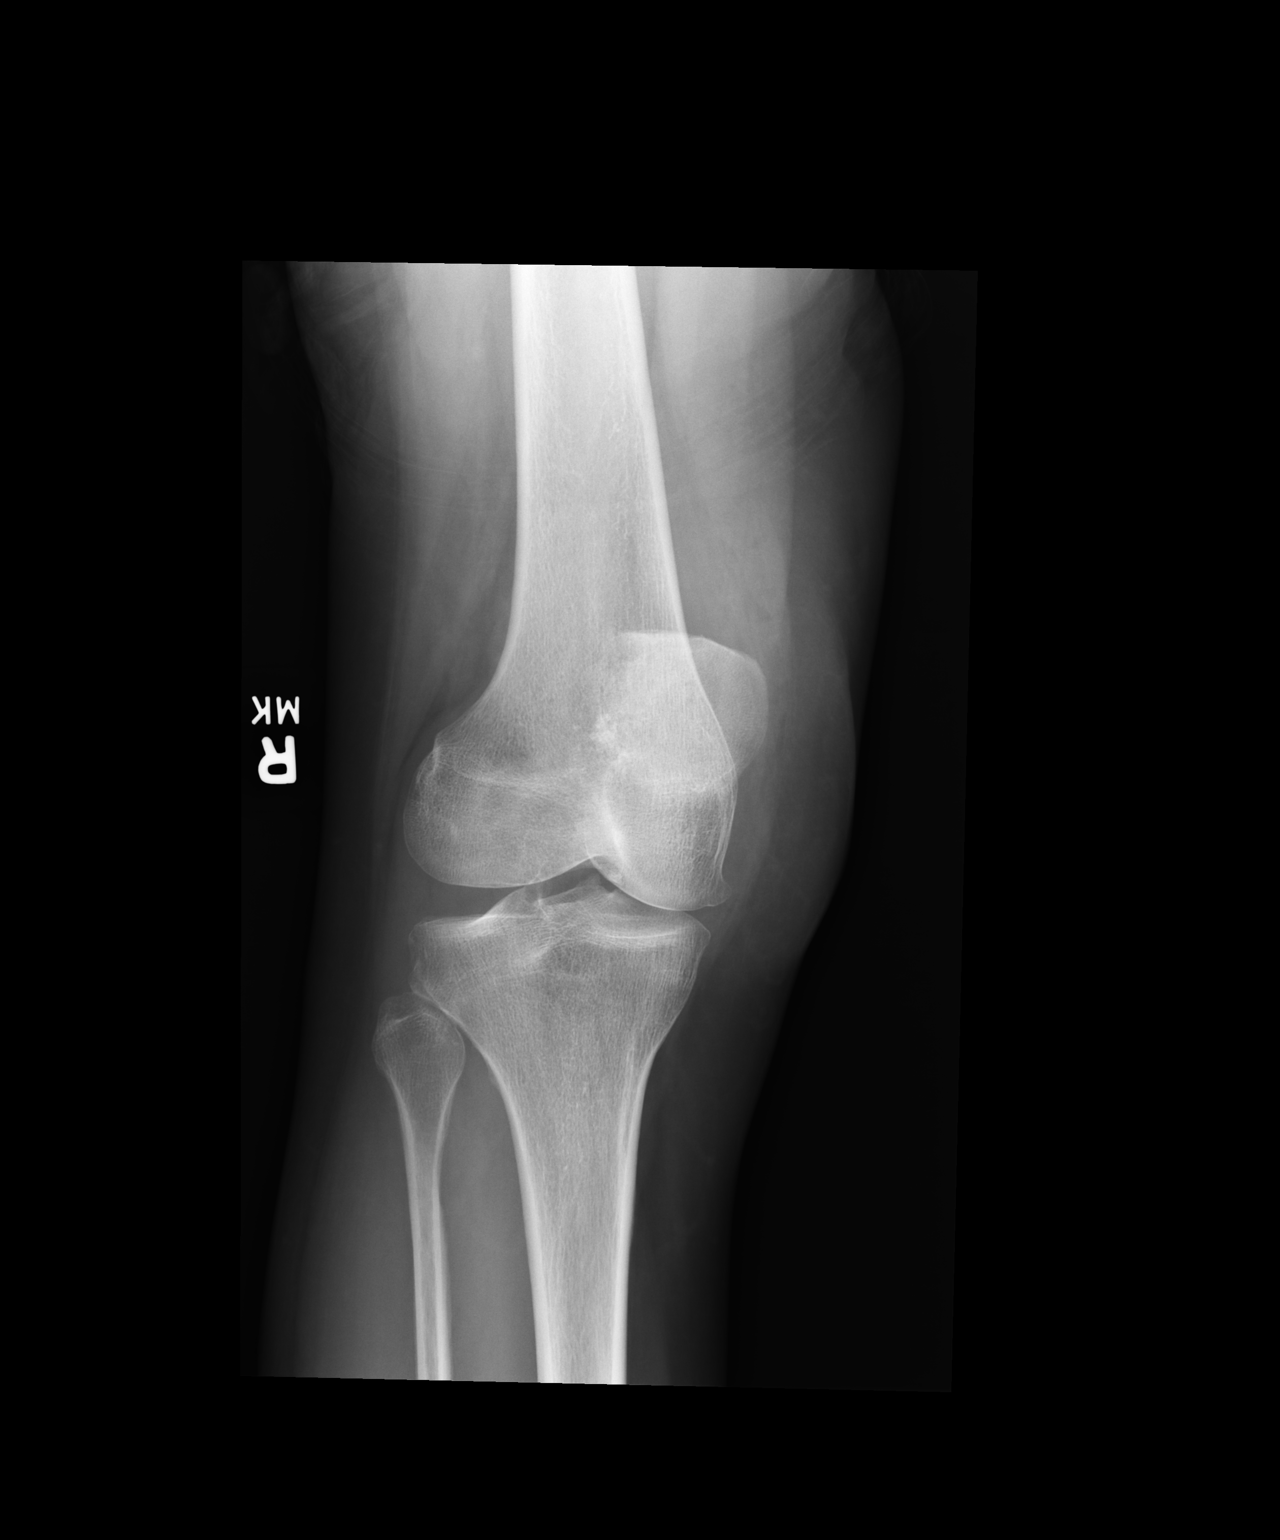
[im 2/3]
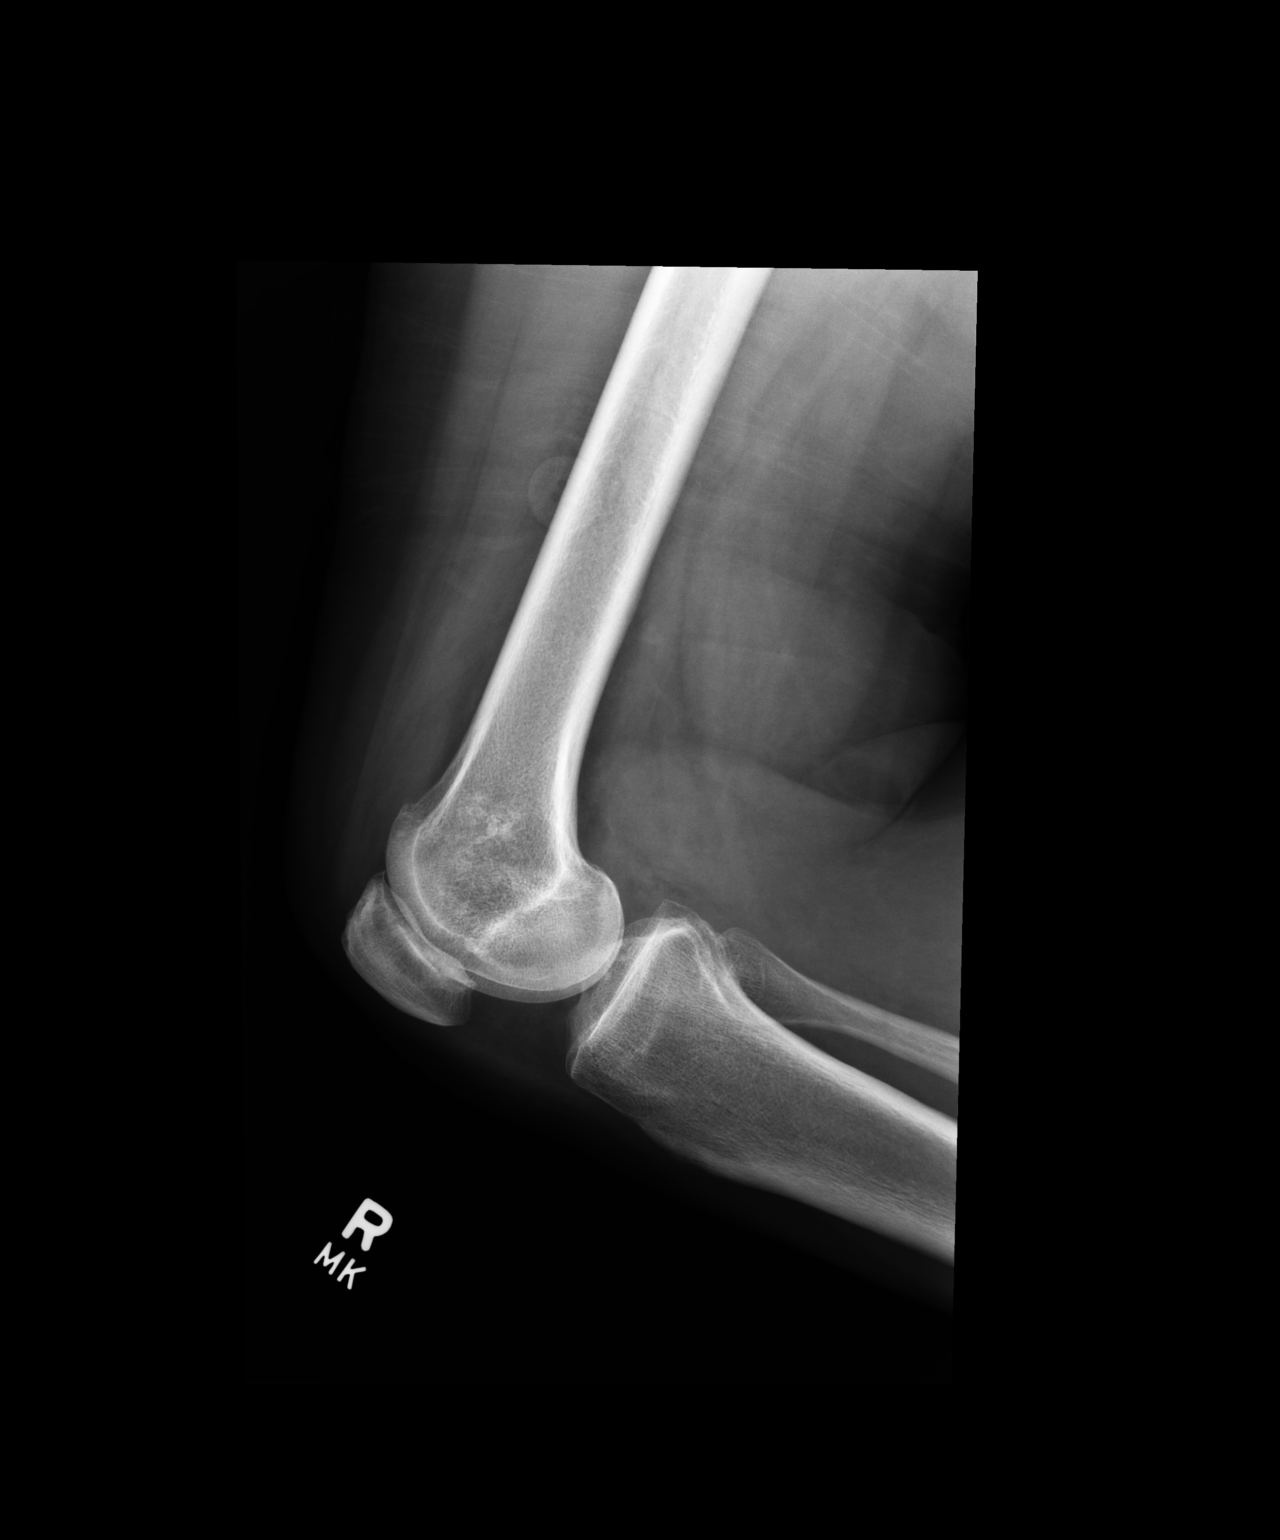
[im 3/3]
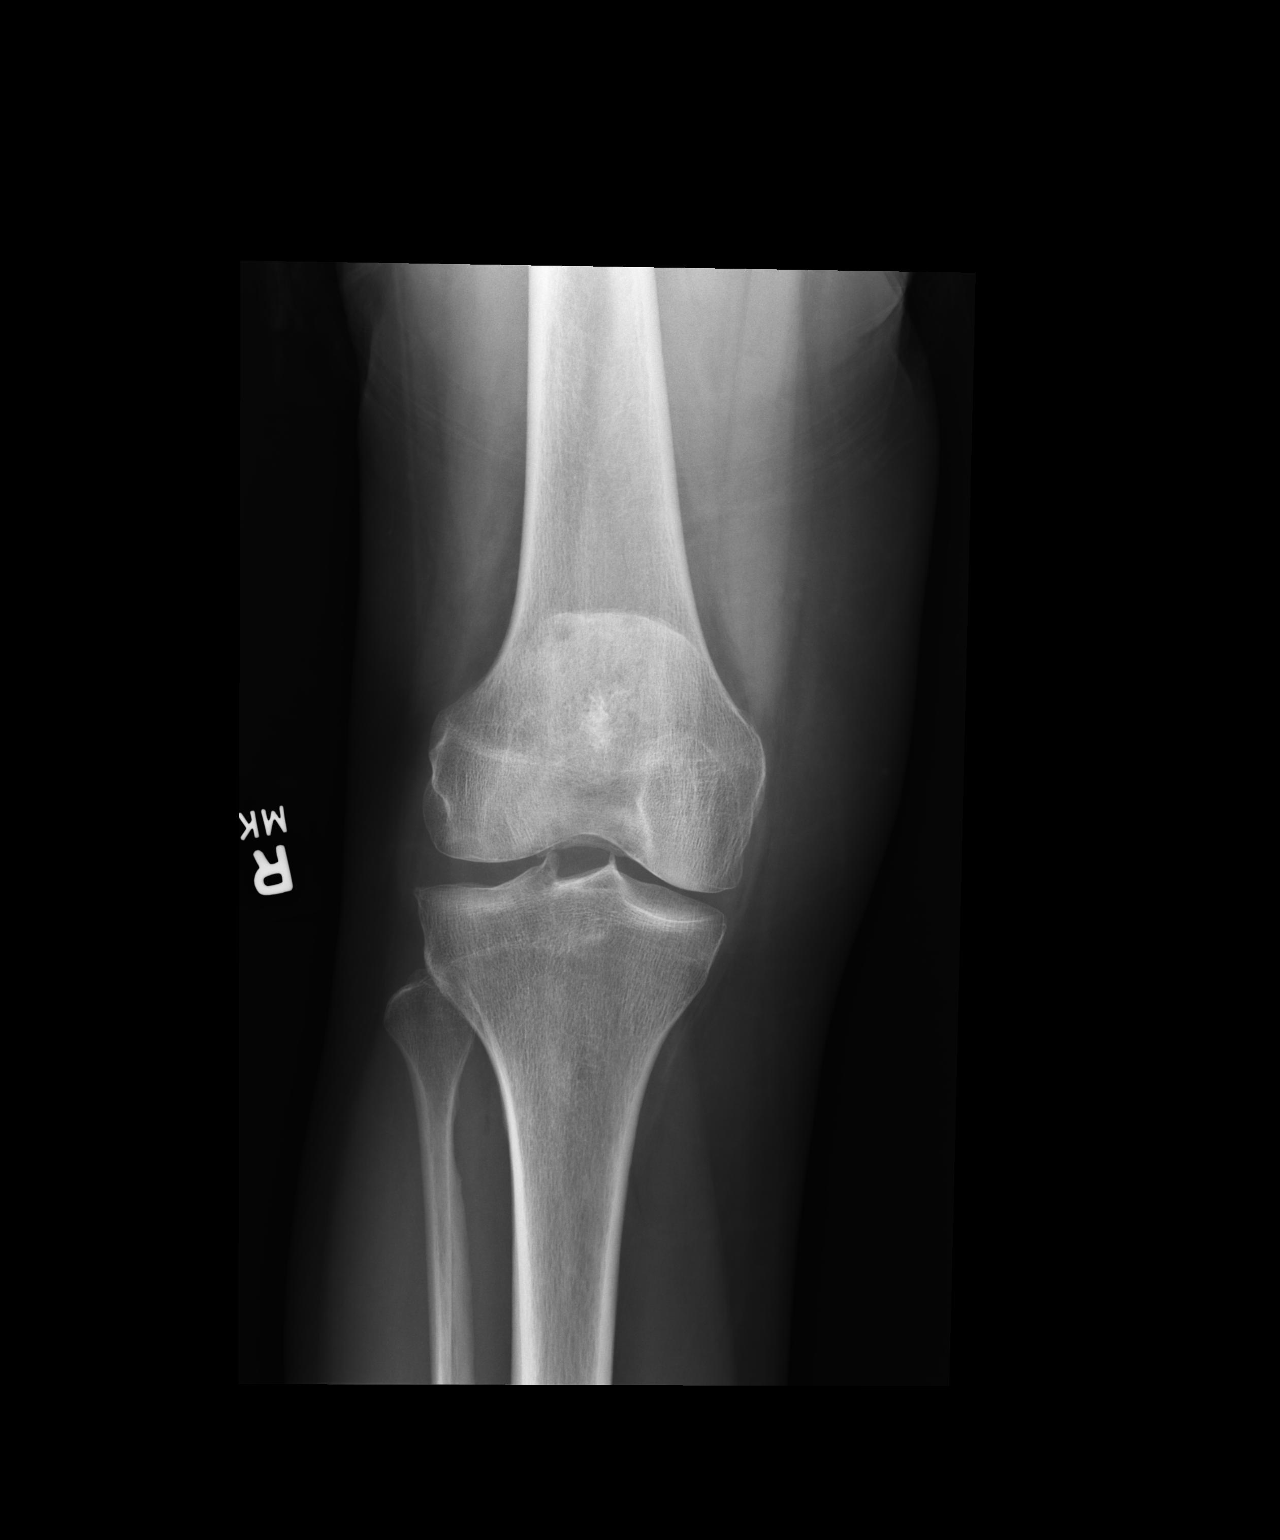

[3 of 3 positions shown; findings below may reference images not displayed]

FINDINGS: Advanced joint space narrowing of the bilateral patellofemoral 
compartments. There is osteophytic spurring greater on the left. Normal bone 
mineralization. No fracture. No effusion. No focal soft tissue swelling.
IMPRESSION: Advanced degenerative changes both knees involving the patellofemoral 
compartments.

## 2021-07-25 IMAGING — MR MRI RIGHT WRIST WITHOUT CONTRAST
4 of 6 series · 17 of 40 positions shown · IV contrast (gadolinium)
Comparison: None.

________________________________________________________________________________________________ 
MRI RIGHT WRIST WITHOUT CONTRAST, 07/25/2021 [DATE]: 
CLINICAL INDICATION: Ulnar sided wrist pain. No improvement after immobilization 
and injection..
TECHNIQUE: Multiplanar, multiecho position MR images of the wrist were performed 
without intravenous gadolinium enhancement. 
Patient was scanned on a 3T magnet.

[Series 201: survey_right · coronal · 5.0mm · 0.89mm/px · 3 of 14 slices shown]
[im 1/14]
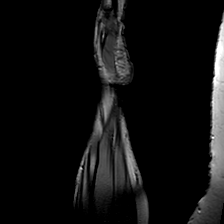
[im 7/14]
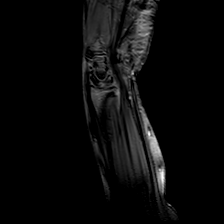
[im 14/14]
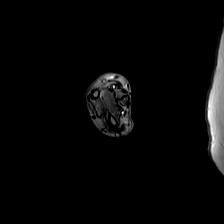

[Series 501: PD fat-sat · oblique · 2.0mm · 0.19mm/px · 7 of 22 slices shown (1 of 2)]
[im 1/22]
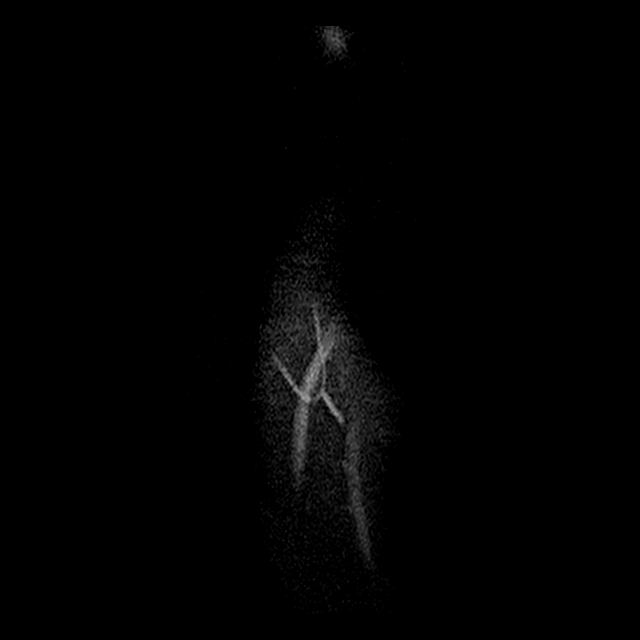
[im 4/22]
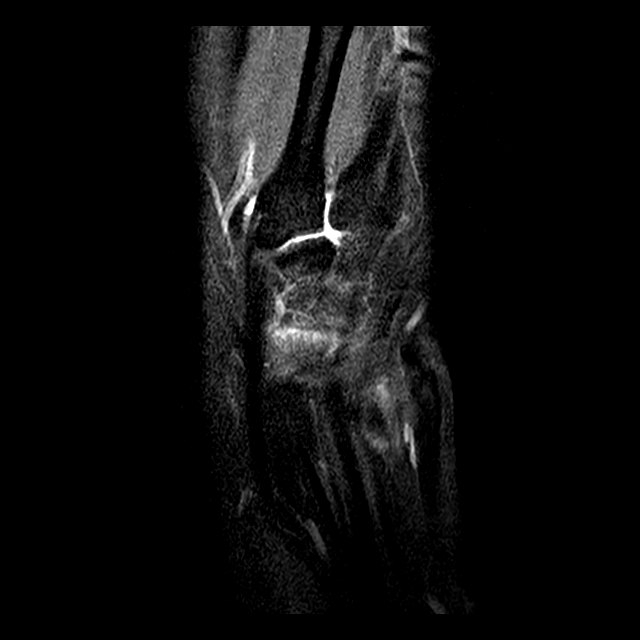
[im 8/22]
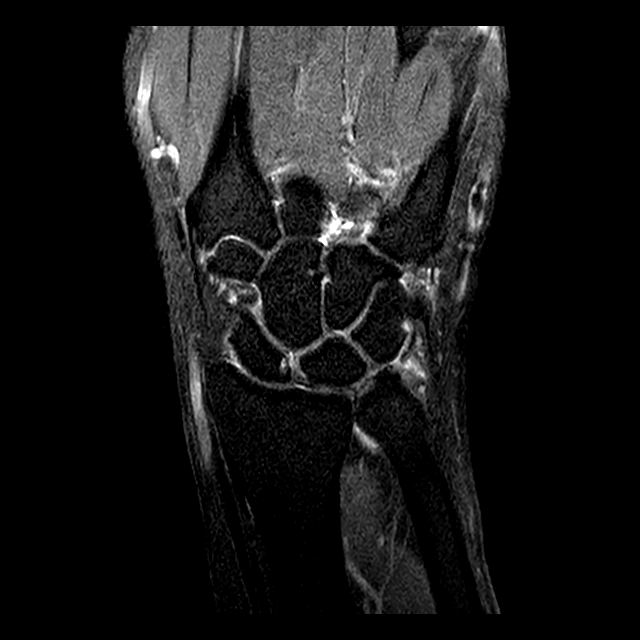
[im 11/22]
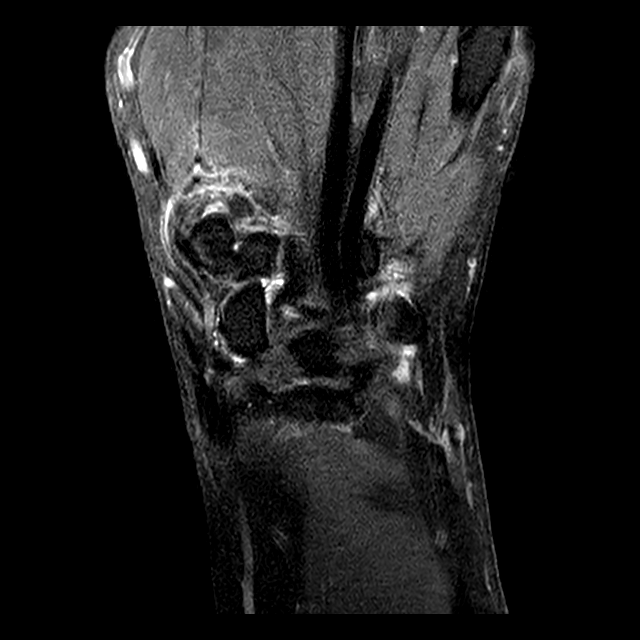
[im 15/22]
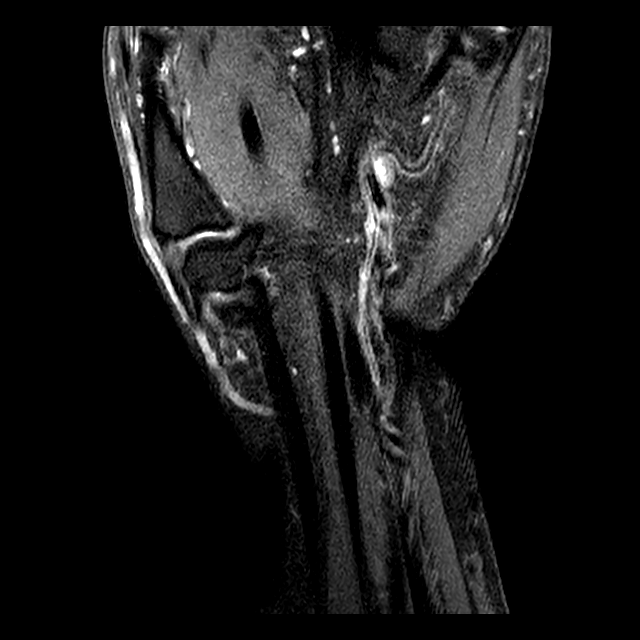
[im 18/22]
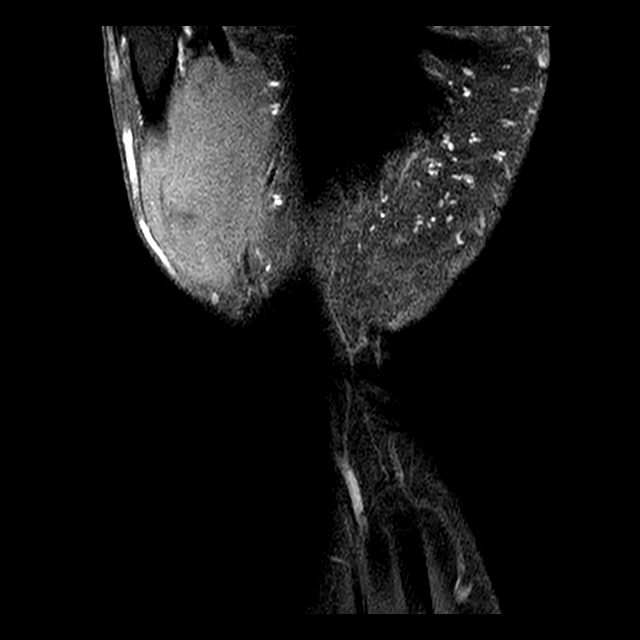
[im 22/22]
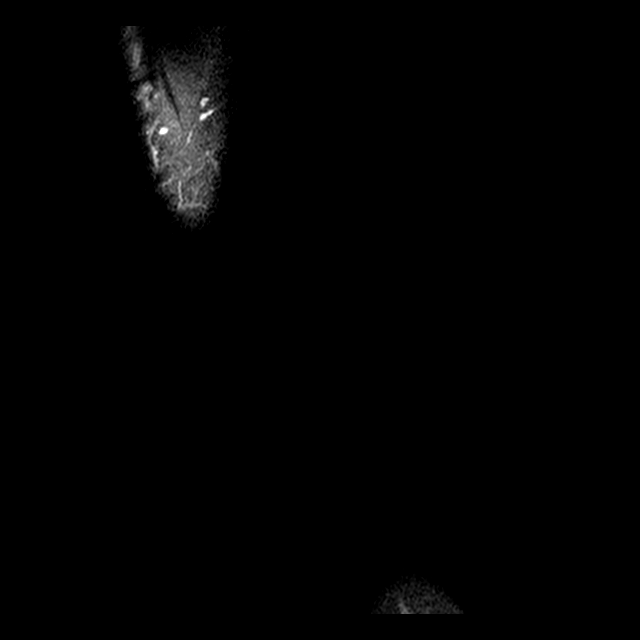

[Series 601: T1 · oblique · 2.0mm · 0.21mm/px · 3 of 22 slices shown]
[im 4/22]
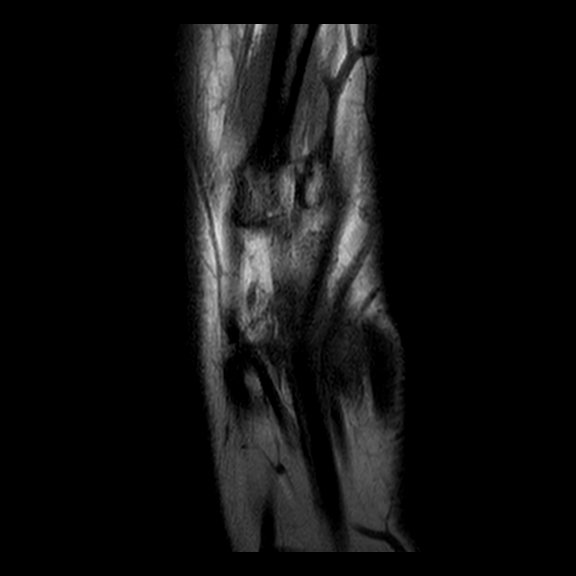
[im 11/22]
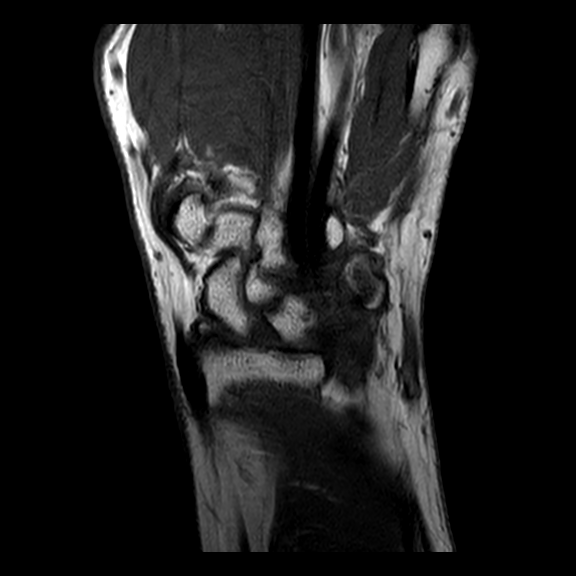
[im 18/22]
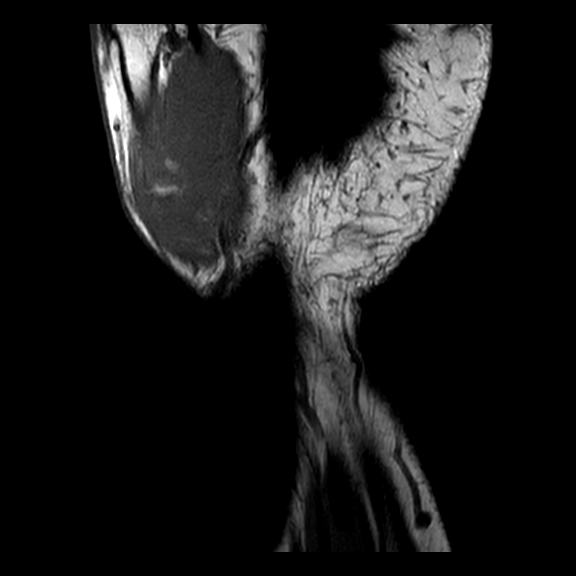

[Series 701: PD fat-sat · oblique · 3.0mm · 0.23mm/px · 4 of 25 slices shown (2 of 2)]
[im 1/25]
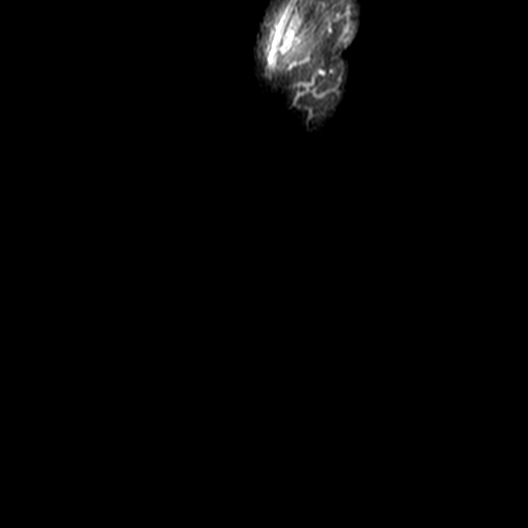
[im 4/25]
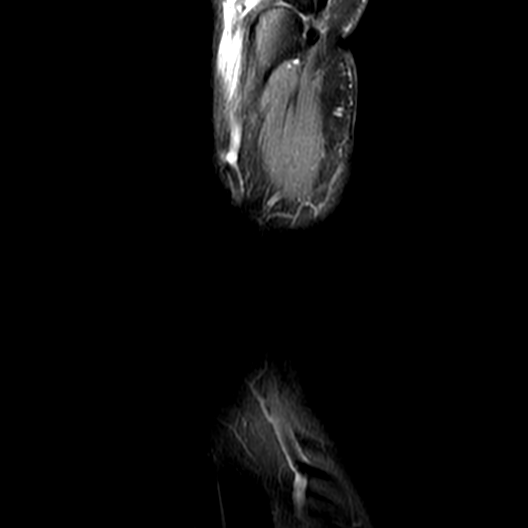
[im 14/25]
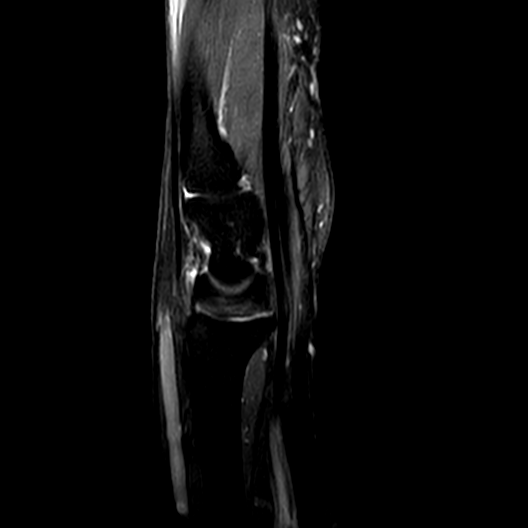
[im 21/25]
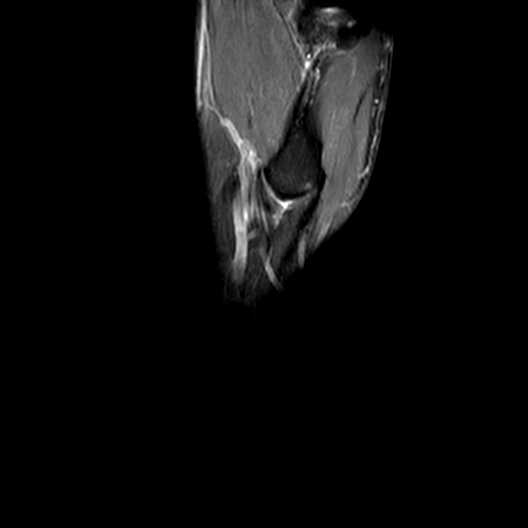

[17 of 40 positions shown; findings below may reference images not displayed]

FINDINGS: BONES AND JOINTS: There is mild scattered articular cartilaginous loss of the 
right wrist. There is mild subcortical cystic change involving the proximal pole 
of the scaphoid and distal ulna. No fracture. Distal ulna is well located within 
the radial sigmoid notch. No prominent joint effusion or synovitis. 
LIGAMENTS: The scapholunate ligament is preserved. The lunotriquetral ligament 
is preserved. The central disc of the TFCC is preserved. The volar and dorsal 
components of the radioulnar ligament of the TFCC are intact. There is partial 
tearing of the ulnocarpal components of the TFCC. The extrinsic ligaments are 
grossly intact.  
TENDONS: There is a small amount of fluid about the extensor carpi ulnaris 
tendon with mild tenosynovitis. There is partial peripheral subluxation of the 
ECU from the level of the ulnar styloid extensor tendons are otherwise 
preserved. Flexor tendons are intact. The flexor retinaculum is not bowed. 
SOFT TISSUES: Musculature is symmetric without mass, signal abnormality or 
atrophy. Neurovascular bundles are negative. Specifically, the ulnar nerve is 
negative at the level of Jia Jia canal. No focal fluid collection or suggestion of 
ganglion.
IMPRESSION: 1.  Mild extensor carpi ulnaris tenosynovitis and partial peripheral subluxation 
at the level of the ulnar styloid suggestive for subsheath tear. 
2.  Mild degenerative changes.

## 2022-04-21 IMAGING — MG MAMMOGRAPHY SCREENING BILATERAL 3[PERSON_NAME]
8 series · 8 of 24 positions shown · non-contrast
Comparison: Comparison was made to prior examinations.

________________________________________________________________________________________________ 
MAMMOGRAPHY SCREENING BILATERAL 3HYKMETE ISLEYEN, 04/21/2022 [DATE]: 
CLINICAL INDICATION: Encounter for screening mammogram.
TECHNIQUE: Digital bilateral mammograms and 3-D Tomosynthesis were obtained. 
These were interpreted both primarily and with the aid of computer-aided 
detection system.  
BREAST DENSITY: (Level C) The breasts are heterogeneously dense, which may 
obscure small masses.

[L MLO]
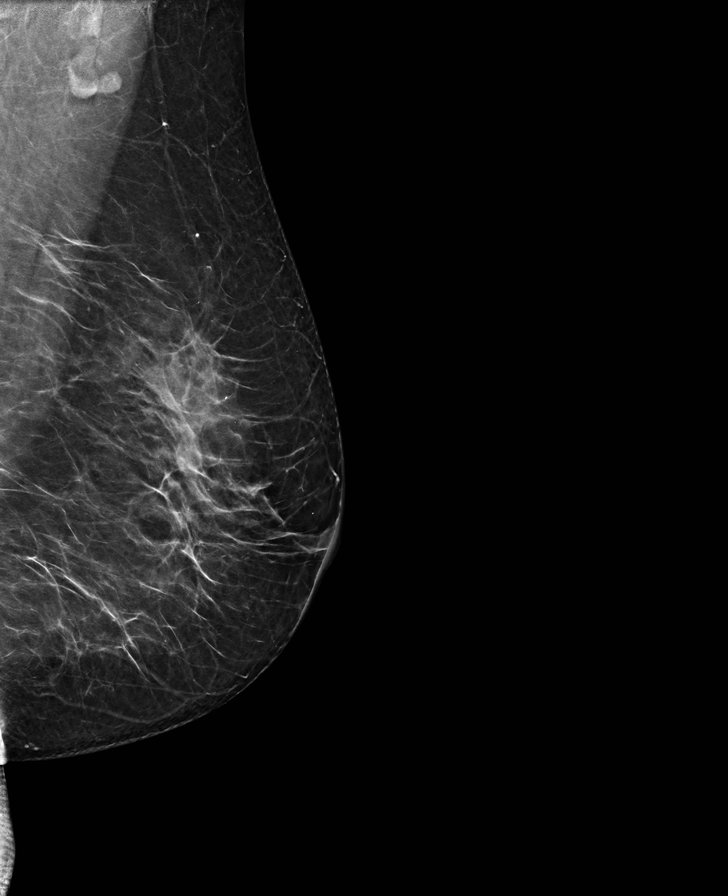

[R CC]
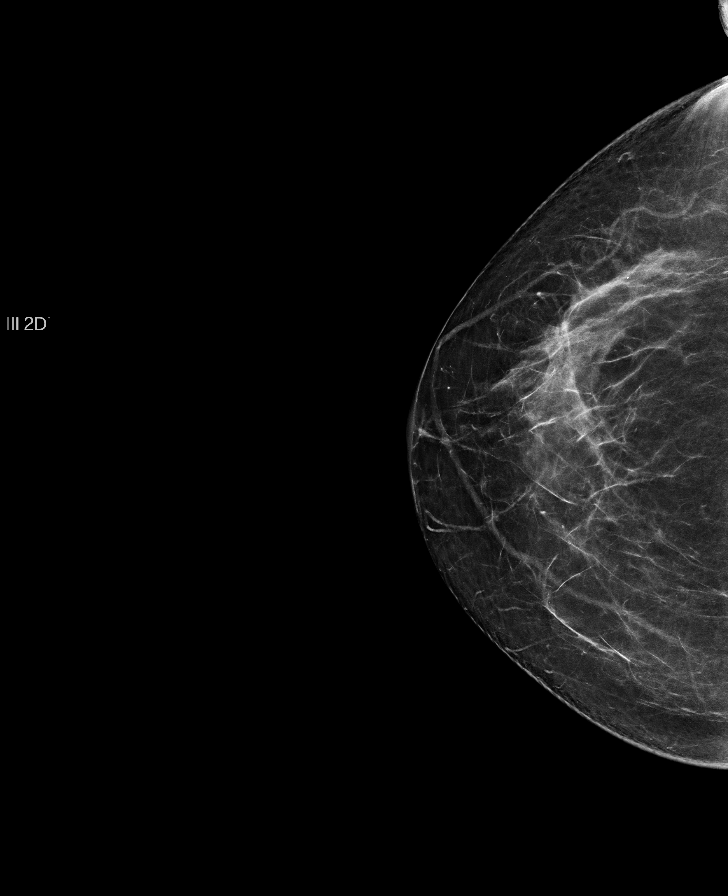

[R MLO]
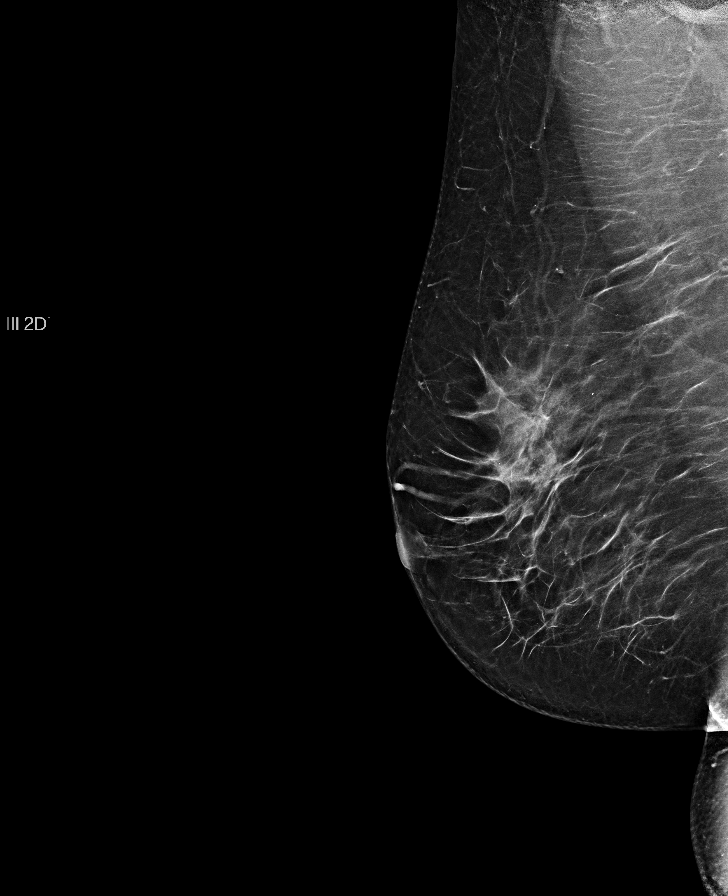

[L CC]
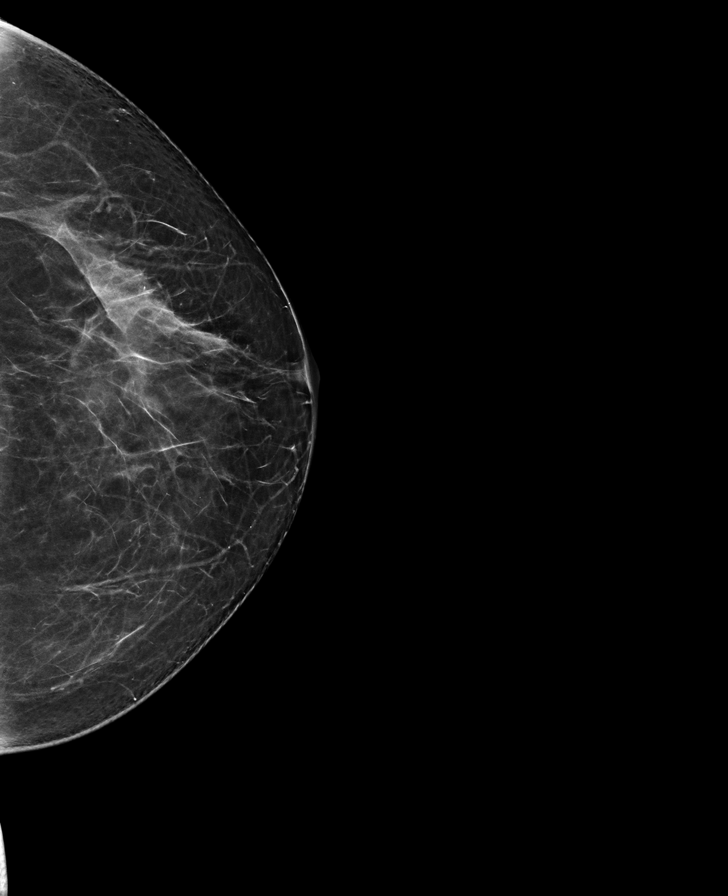

[L CC tomo · tomo slice 37/72.0]
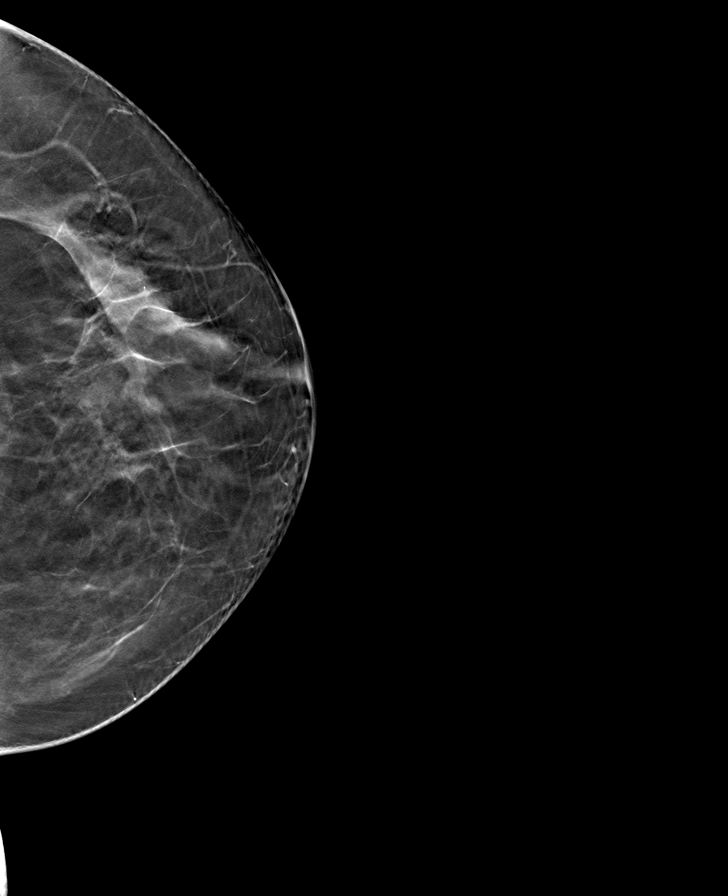

[L MLO tomo · tomo slice 38/75.0]
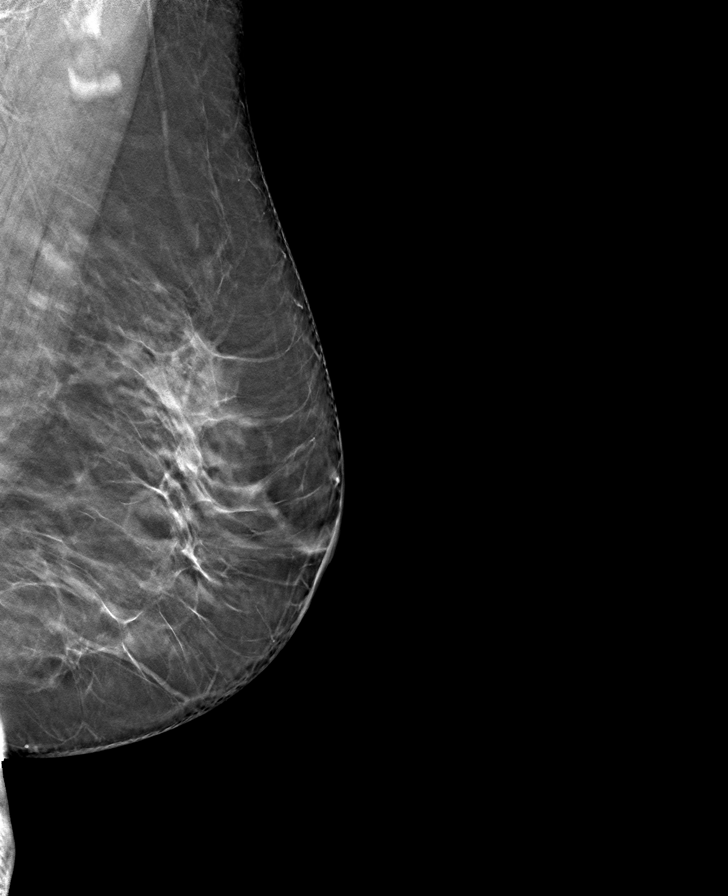

[R MLO tomo · tomo slice 40/79.0]
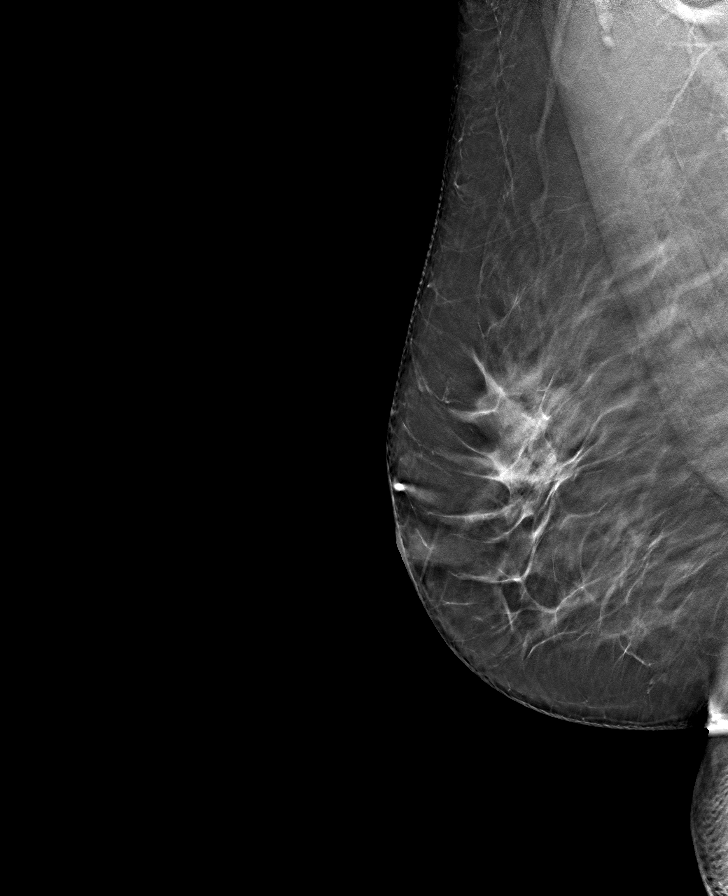

[R CC tomo · tomo slice 37/74.0]
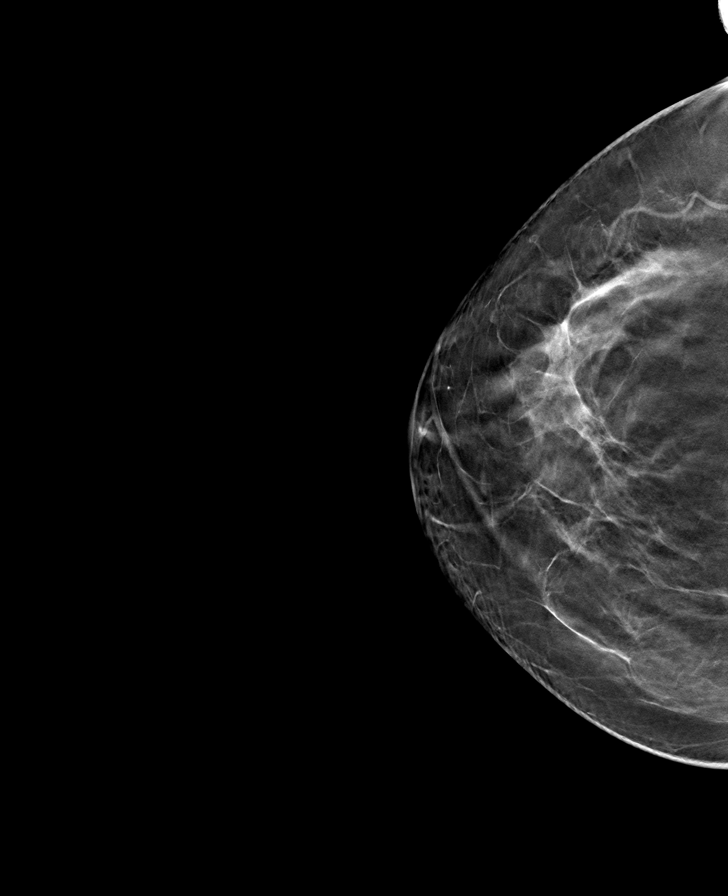

[8 of 24 positions shown; findings below may reference images not displayed]

FINDINGS: Benign calcification. No suspicious mass, calcifications, or area of 
architectural distortion in either breast.
IMPRESSION: Stable mammogram. 
(BI-RADS 2) Benign findings. Routine mammographic follow-up is recommended.

## 2023-05-11 IMAGING — MG MAMMOGRAPHY SCREENING BILATERAL 3[PERSON_NAME]
8 series · 8 of 24 positions shown · non-contrast
Comparison: 04/21/2022 and exams dating back to 01/17/2012

________________________________________________________________________________________________ 
MAMMOGRAPHY SCREENING BILATERAL 3CASH WHISENHUNT, 05/11/2023 [DATE]: 
CLINICAL INDICATION: Encounter for screening mammogram.
TECHNIQUE: Digital bilateral mammograms and 3-D Tomosynthesis were obtained. 
These were interpreted both primarily and with the aid of computer-aided 
detection system.  
BREAST DENSITY: (Level B) There are scattered areas of fibroglandular density.

[L CC]
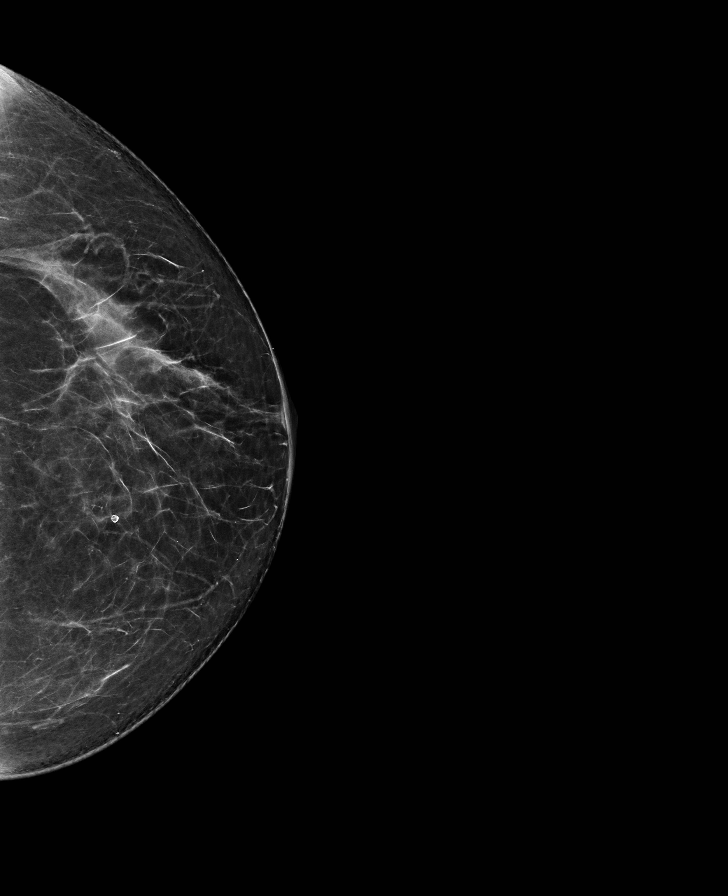

[L MLO]
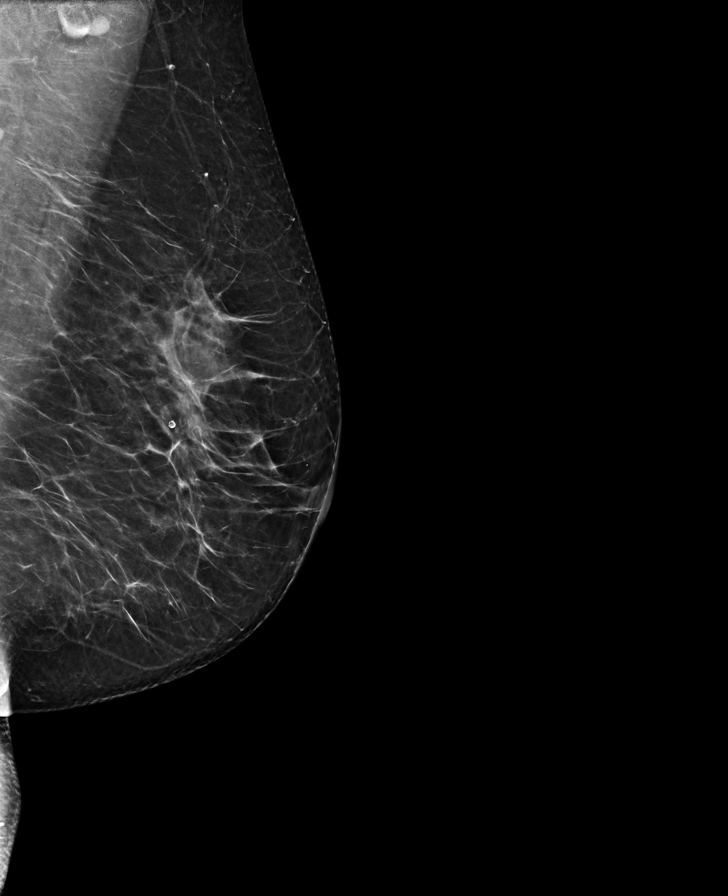

[R CC]
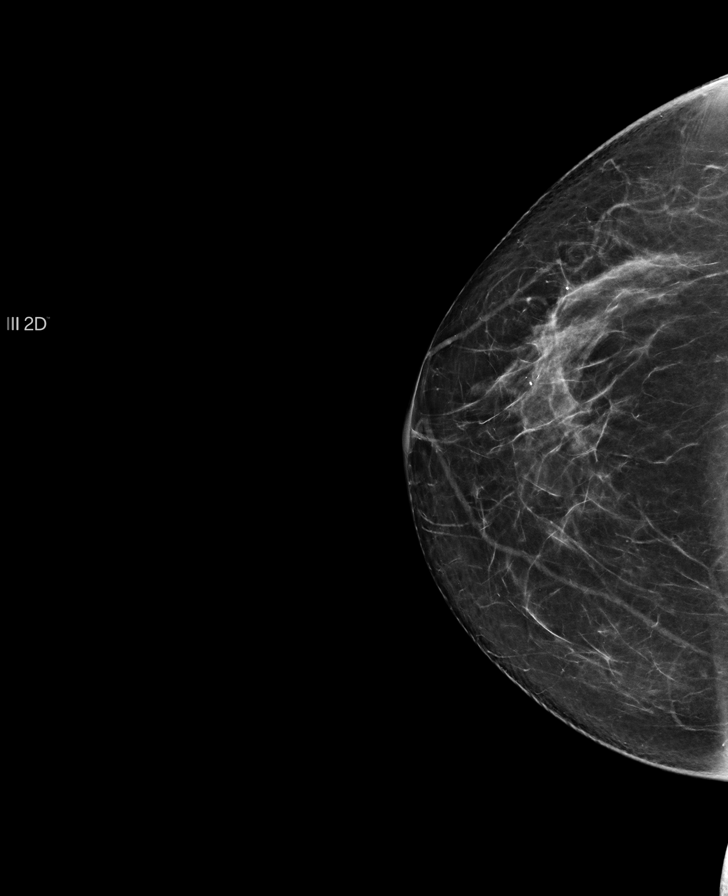

[R MLO]
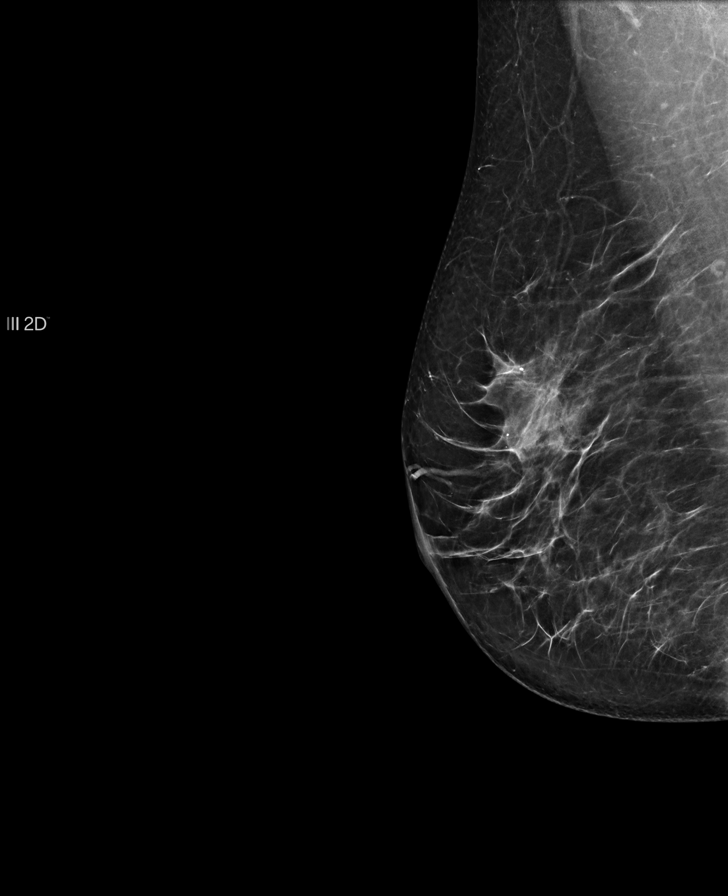

[L CC tomo · tomo slice 13/26.0]
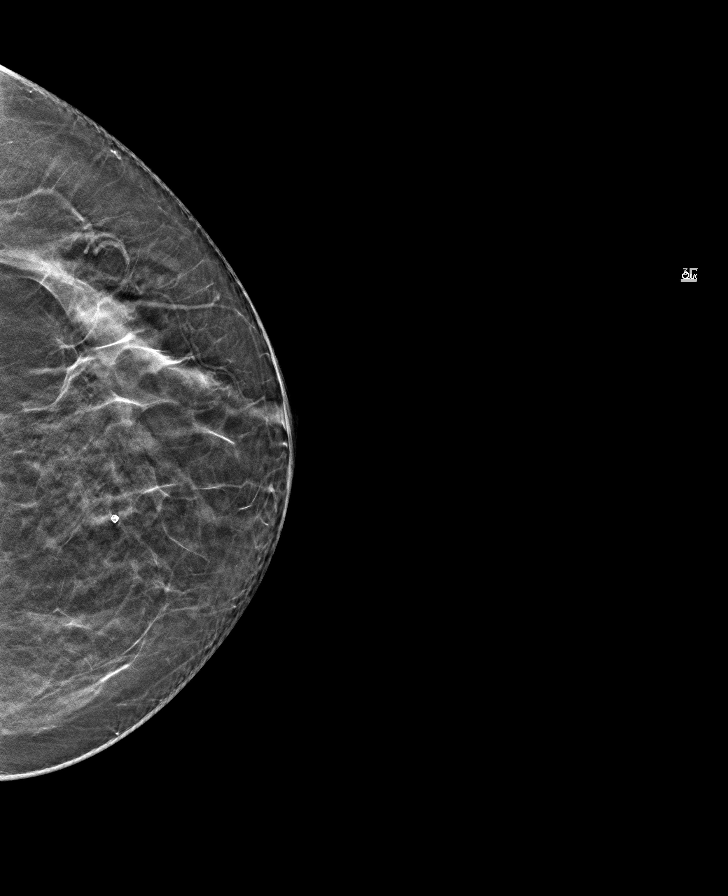

[R CC tomo · tomo slice 13/26.0]
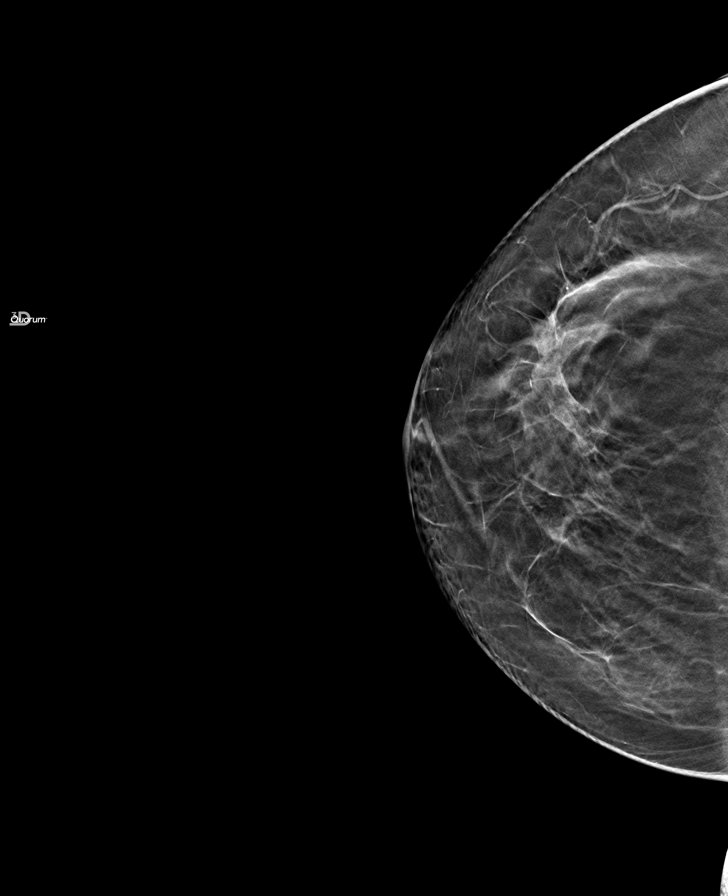

[R MLO tomo · tomo slice 13/26.0]
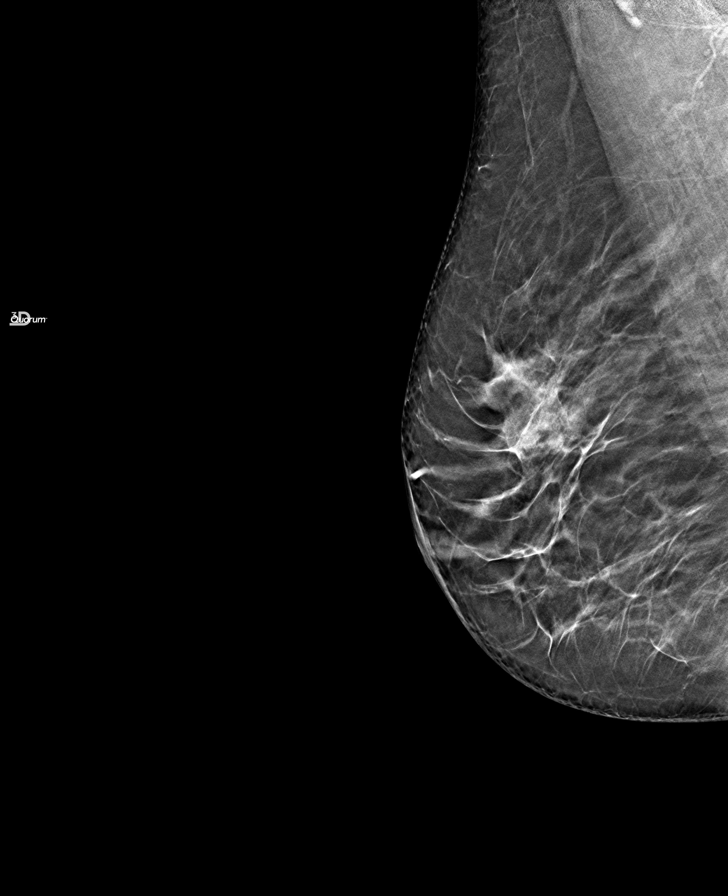

[L MLO tomo · tomo slice 13/26.0]
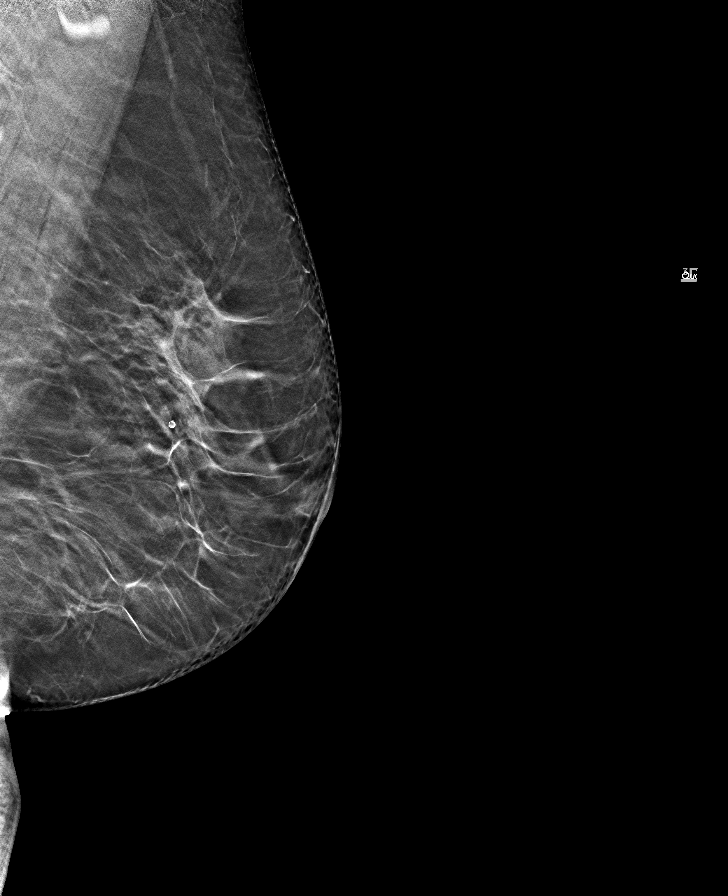

[8 of 24 positions shown; findings below may reference images not displayed]

FINDINGS: No suspicious mass, calcifications, or area of architectural 
distortion in either breast. Overall stable mammographic appearance.
IMPRESSION: No mammographic findings suggestive for malignancy. 
(BI-RADS 2) Benign findings. Routine mammographic follow-up is recommended.
